# Patient Record
Sex: Female | Born: 1950 | Race: White | Hispanic: No | Smoking: Current every day smoker
Health system: Southern US, Community
[De-identification: ages and names within clinical notes are randomized; demographics above are authoritative.]

## PROBLEM LIST (undated history)

## (undated) DIAGNOSIS — Z860100 Personal history of colon polyps, unspecified: Secondary | ICD-10-CM

## (undated) DIAGNOSIS — F419 Anxiety disorder, unspecified: Secondary | ICD-10-CM

## (undated) DIAGNOSIS — E7800 Pure hypercholesterolemia, unspecified: Secondary | ICD-10-CM

## (undated) DIAGNOSIS — N809 Endometriosis, unspecified: Secondary | ICD-10-CM

## (undated) DIAGNOSIS — K219 Gastro-esophageal reflux disease without esophagitis: Secondary | ICD-10-CM

## (undated) DIAGNOSIS — J189 Pneumonia, unspecified organism: Secondary | ICD-10-CM

## (undated) DIAGNOSIS — K589 Irritable bowel syndrome without diarrhea: Secondary | ICD-10-CM

## (undated) DIAGNOSIS — J45909 Unspecified asthma, uncomplicated: Secondary | ICD-10-CM

## (undated) HISTORY — DX: Anxiety disorder, unspecified: F41.9

## (undated) HISTORY — DX: Irritable bowel syndrome, unspecified: K58.9

## (undated) HISTORY — DX: Unspecified asthma, uncomplicated: J45.909

## (undated) HISTORY — PX: ELBOW SURGERY: SHX618

## (undated) HISTORY — DX: Personal history of colonic polyps: Z86.010

## (undated) HISTORY — PX: HAND SURGERY: SHX662

## (undated) HISTORY — DX: Pure hypercholesterolemia, unspecified: E78.00

## (undated) HISTORY — DX: Endometriosis, unspecified: N80.9

## (undated) HISTORY — DX: Pneumonia, unspecified organism: J18.9

## (undated) HISTORY — PX: LAPAROTOMY: SHX154

## (undated) HISTORY — PX: ABDOMINAL HYSTERECTOMY: SHX81

## (undated) HISTORY — DX: Gastro-esophageal reflux disease without esophagitis: K21.9

## (undated) HISTORY — DX: Personal history of colon polyps, unspecified: Z86.0100

## (undated) HISTORY — PX: STOMACH SURGERY: SHX791

---

## 1998-03-06 ENCOUNTER — Ambulatory Visit (HOSPITAL_BASED_OUTPATIENT_CLINIC_OR_DEPARTMENT_OTHER): Admission: RE | Admit: 1998-03-06 | Discharge: 1998-03-06 | Payer: Self-pay | Admitting: Orthopaedic Surgery

## 2013-11-04 HISTORY — PX: COLONOSCOPY: SHX174

## 2015-10-01 DIAGNOSIS — J449 Chronic obstructive pulmonary disease, unspecified: Secondary | ICD-10-CM

## 2015-10-01 DIAGNOSIS — R911 Solitary pulmonary nodule: Secondary | ICD-10-CM | POA: Insufficient documentation

## 2015-10-01 DIAGNOSIS — J479 Bronchiectasis, uncomplicated: Secondary | ICD-10-CM

## 2015-10-01 DIAGNOSIS — A310 Pulmonary mycobacterial infection: Secondary | ICD-10-CM | POA: Insufficient documentation

## 2015-10-01 HISTORY — DX: Solitary pulmonary nodule: R91.1

## 2015-10-01 HISTORY — DX: Chronic obstructive pulmonary disease, unspecified: J44.9

## 2015-10-01 HISTORY — DX: Pulmonary mycobacterial infection: A31.0

## 2015-10-01 HISTORY — DX: Bronchiectasis, uncomplicated: J47.9

## 2015-11-19 HISTORY — PX: ESOPHAGOGASTRODUODENOSCOPY: SHX1529

## 2016-05-05 DIAGNOSIS — L84 Corns and callosities: Secondary | ICD-10-CM

## 2016-05-05 DIAGNOSIS — M21622 Bunionette of left foot: Secondary | ICD-10-CM

## 2016-05-05 DIAGNOSIS — M89372 Hypertrophy of bone, left ankle and foot: Secondary | ICD-10-CM

## 2016-05-05 HISTORY — DX: Hypertrophy of bone, left ankle and foot: M89.372

## 2016-05-05 HISTORY — DX: Bunionette of left foot: M21.622

## 2016-05-05 HISTORY — DX: Corns and callosities: L84

## 2016-05-31 DIAGNOSIS — L84 Corns and callosities: Secondary | ICD-10-CM | POA: Diagnosis not present

## 2016-05-31 DIAGNOSIS — M21622 Bunionette of left foot: Secondary | ICD-10-CM | POA: Diagnosis not present

## 2016-05-31 DIAGNOSIS — M89372 Hypertrophy of bone, left ankle and foot: Secondary | ICD-10-CM | POA: Diagnosis not present

## 2016-06-14 DIAGNOSIS — R05 Cough: Secondary | ICD-10-CM | POA: Diagnosis not present

## 2016-06-14 DIAGNOSIS — R0602 Shortness of breath: Secondary | ICD-10-CM | POA: Diagnosis not present

## 2016-06-14 DIAGNOSIS — J0100 Acute maxillary sinusitis, unspecified: Secondary | ICD-10-CM | POA: Diagnosis not present

## 2016-08-25 DIAGNOSIS — F331 Major depressive disorder, recurrent, moderate: Secondary | ICD-10-CM | POA: Diagnosis not present

## 2016-08-25 DIAGNOSIS — E782 Mixed hyperlipidemia: Secondary | ICD-10-CM | POA: Diagnosis not present

## 2016-08-25 DIAGNOSIS — J449 Chronic obstructive pulmonary disease, unspecified: Secondary | ICD-10-CM | POA: Diagnosis not present

## 2016-08-25 DIAGNOSIS — H2513 Age-related nuclear cataract, bilateral: Secondary | ICD-10-CM | POA: Diagnosis not present

## 2016-08-25 DIAGNOSIS — K219 Gastro-esophageal reflux disease without esophagitis: Secondary | ICD-10-CM | POA: Diagnosis not present

## 2016-08-25 DIAGNOSIS — M542 Cervicalgia: Secondary | ICD-10-CM | POA: Diagnosis not present

## 2016-10-04 DIAGNOSIS — M89372 Hypertrophy of bone, left ankle and foot: Secondary | ICD-10-CM | POA: Diagnosis not present

## 2016-10-04 DIAGNOSIS — L84 Corns and callosities: Secondary | ICD-10-CM | POA: Diagnosis not present

## 2016-10-04 DIAGNOSIS — M21622 Bunionette of left foot: Secondary | ICD-10-CM | POA: Diagnosis not present

## 2016-10-19 DIAGNOSIS — N393 Stress incontinence (female) (male): Secondary | ICD-10-CM | POA: Diagnosis not present

## 2016-10-19 DIAGNOSIS — N3000 Acute cystitis without hematuria: Secondary | ICD-10-CM | POA: Diagnosis not present

## 2016-11-17 DIAGNOSIS — Z01818 Encounter for other preprocedural examination: Secondary | ICD-10-CM | POA: Diagnosis not present

## 2016-11-17 DIAGNOSIS — N393 Stress incontinence (female) (male): Secondary | ICD-10-CM | POA: Diagnosis not present

## 2016-11-17 DIAGNOSIS — J45909 Unspecified asthma, uncomplicated: Secondary | ICD-10-CM | POA: Diagnosis not present

## 2016-11-17 DIAGNOSIS — Z79899 Other long term (current) drug therapy: Secondary | ICD-10-CM | POA: Diagnosis not present

## 2016-11-17 DIAGNOSIS — N812 Incomplete uterovaginal prolapse: Secondary | ICD-10-CM | POA: Diagnosis not present

## 2016-11-17 DIAGNOSIS — Z87891 Personal history of nicotine dependence: Secondary | ICD-10-CM | POA: Diagnosis not present

## 2016-11-17 DIAGNOSIS — K219 Gastro-esophageal reflux disease without esophagitis: Secondary | ICD-10-CM | POA: Diagnosis not present

## 2016-11-17 HISTORY — PX: CYSTOSCOPY: SUR368

## 2016-11-22 DIAGNOSIS — J018 Other acute sinusitis: Secondary | ICD-10-CM | POA: Diagnosis not present

## 2016-11-24 DIAGNOSIS — N393 Stress incontinence (female) (male): Secondary | ICD-10-CM | POA: Diagnosis not present

## 2016-11-24 DIAGNOSIS — N3020 Other chronic cystitis without hematuria: Secondary | ICD-10-CM | POA: Diagnosis not present

## 2016-12-20 DIAGNOSIS — R32 Unspecified urinary incontinence: Secondary | ICD-10-CM | POA: Diagnosis not present

## 2016-12-20 DIAGNOSIS — N3020 Other chronic cystitis without hematuria: Secondary | ICD-10-CM | POA: Diagnosis not present

## 2016-12-22 DIAGNOSIS — R1013 Epigastric pain: Secondary | ICD-10-CM | POA: Diagnosis not present

## 2016-12-22 DIAGNOSIS — K219 Gastro-esophageal reflux disease without esophagitis: Secondary | ICD-10-CM | POA: Diagnosis not present

## 2017-01-03 DIAGNOSIS — N3020 Other chronic cystitis without hematuria: Secondary | ICD-10-CM | POA: Diagnosis not present

## 2017-01-03 DIAGNOSIS — N393 Stress incontinence (female) (male): Secondary | ICD-10-CM | POA: Diagnosis not present

## 2017-01-03 DIAGNOSIS — N3281 Overactive bladder: Secondary | ICD-10-CM | POA: Diagnosis not present

## 2017-01-03 DIAGNOSIS — R32 Unspecified urinary incontinence: Secondary | ICD-10-CM | POA: Diagnosis not present

## 2017-01-03 DIAGNOSIS — N3090 Cystitis, unspecified without hematuria: Secondary | ICD-10-CM | POA: Diagnosis not present

## 2017-01-13 DIAGNOSIS — I8310 Varicose veins of unspecified lower extremity with inflammation: Secondary | ICD-10-CM | POA: Diagnosis not present

## 2017-01-13 DIAGNOSIS — R233 Spontaneous ecchymoses: Secondary | ICD-10-CM | POA: Diagnosis not present

## 2017-01-27 DIAGNOSIS — N393 Stress incontinence (female) (male): Secondary | ICD-10-CM | POA: Diagnosis not present

## 2017-01-27 DIAGNOSIS — N3020 Other chronic cystitis without hematuria: Secondary | ICD-10-CM | POA: Diagnosis not present

## 2017-01-30 DIAGNOSIS — R0602 Shortness of breath: Secondary | ICD-10-CM | POA: Diagnosis not present

## 2017-01-30 DIAGNOSIS — J189 Pneumonia, unspecified organism: Secondary | ICD-10-CM | POA: Diagnosis not present

## 2017-02-28 DIAGNOSIS — R0602 Shortness of breath: Secondary | ICD-10-CM | POA: Diagnosis not present

## 2017-02-28 DIAGNOSIS — J189 Pneumonia, unspecified organism: Secondary | ICD-10-CM | POA: Diagnosis not present

## 2017-03-09 DIAGNOSIS — J441 Chronic obstructive pulmonary disease with (acute) exacerbation: Secondary | ICD-10-CM | POA: Diagnosis not present

## 2017-03-22 DIAGNOSIS — J4520 Mild intermittent asthma, uncomplicated: Secondary | ICD-10-CM | POA: Diagnosis not present

## 2017-03-22 DIAGNOSIS — J310 Chronic rhinitis: Secondary | ICD-10-CM | POA: Diagnosis not present

## 2017-03-22 DIAGNOSIS — A310 Pulmonary mycobacterial infection: Secondary | ICD-10-CM | POA: Diagnosis not present

## 2017-03-22 DIAGNOSIS — R5383 Other fatigue: Secondary | ICD-10-CM | POA: Diagnosis not present

## 2017-04-04 DIAGNOSIS — A310 Pulmonary mycobacterial infection: Secondary | ICD-10-CM | POA: Diagnosis not present

## 2017-04-04 DIAGNOSIS — J4520 Mild intermittent asthma, uncomplicated: Secondary | ICD-10-CM | POA: Diagnosis not present

## 2017-04-25 DIAGNOSIS — Z1231 Encounter for screening mammogram for malignant neoplasm of breast: Secondary | ICD-10-CM | POA: Diagnosis not present

## 2017-04-27 DIAGNOSIS — E7800 Pure hypercholesterolemia, unspecified: Secondary | ICD-10-CM | POA: Diagnosis not present

## 2017-04-27 DIAGNOSIS — K219 Gastro-esophageal reflux disease without esophagitis: Secondary | ICD-10-CM | POA: Diagnosis not present

## 2017-04-27 DIAGNOSIS — M542 Cervicalgia: Secondary | ICD-10-CM | POA: Diagnosis not present

## 2017-04-27 DIAGNOSIS — R5381 Other malaise: Secondary | ICD-10-CM | POA: Diagnosis not present

## 2017-04-27 DIAGNOSIS — E669 Obesity, unspecified: Secondary | ICD-10-CM | POA: Diagnosis not present

## 2017-04-27 DIAGNOSIS — J328 Other chronic sinusitis: Secondary | ICD-10-CM | POA: Diagnosis not present

## 2017-04-28 DIAGNOSIS — J4520 Mild intermittent asthma, uncomplicated: Secondary | ICD-10-CM | POA: Diagnosis not present

## 2017-04-28 DIAGNOSIS — J310 Chronic rhinitis: Secondary | ICD-10-CM | POA: Diagnosis not present

## 2017-04-28 DIAGNOSIS — R5383 Other fatigue: Secondary | ICD-10-CM | POA: Diagnosis not present

## 2017-04-28 DIAGNOSIS — A310 Pulmonary mycobacterial infection: Secondary | ICD-10-CM | POA: Diagnosis not present

## 2017-05-23 DIAGNOSIS — J310 Chronic rhinitis: Secondary | ICD-10-CM | POA: Diagnosis not present

## 2017-05-23 DIAGNOSIS — J4520 Mild intermittent asthma, uncomplicated: Secondary | ICD-10-CM | POA: Diagnosis not present

## 2017-05-23 DIAGNOSIS — A310 Pulmonary mycobacterial infection: Secondary | ICD-10-CM | POA: Diagnosis not present

## 2017-05-25 DIAGNOSIS — I700 Atherosclerosis of aorta: Secondary | ICD-10-CM | POA: Diagnosis not present

## 2017-05-25 DIAGNOSIS — J984 Other disorders of lung: Secondary | ICD-10-CM | POA: Diagnosis not present

## 2017-05-25 DIAGNOSIS — A310 Pulmonary mycobacterial infection: Secondary | ICD-10-CM | POA: Diagnosis not present

## 2017-05-30 DIAGNOSIS — J310 Chronic rhinitis: Secondary | ICD-10-CM | POA: Diagnosis not present

## 2017-05-30 DIAGNOSIS — A310 Pulmonary mycobacterial infection: Secondary | ICD-10-CM | POA: Diagnosis not present

## 2017-05-30 DIAGNOSIS — J4520 Mild intermittent asthma, uncomplicated: Secondary | ICD-10-CM | POA: Diagnosis not present

## 2017-05-30 DIAGNOSIS — R5383 Other fatigue: Secondary | ICD-10-CM | POA: Diagnosis not present

## 2017-06-02 DIAGNOSIS — R918 Other nonspecific abnormal finding of lung field: Secondary | ICD-10-CM | POA: Diagnosis not present

## 2017-06-02 DIAGNOSIS — R846 Abnormal cytological findings in specimens from respiratory organs and thorax: Secondary | ICD-10-CM | POA: Diagnosis not present

## 2017-06-02 DIAGNOSIS — Z79899 Other long term (current) drug therapy: Secondary | ICD-10-CM | POA: Diagnosis not present

## 2017-06-02 DIAGNOSIS — K219 Gastro-esophageal reflux disease without esophagitis: Secondary | ICD-10-CM | POA: Diagnosis not present

## 2017-06-02 DIAGNOSIS — Z7951 Long term (current) use of inhaled steroids: Secondary | ICD-10-CM | POA: Diagnosis not present

## 2017-06-02 DIAGNOSIS — J45909 Unspecified asthma, uncomplicated: Secondary | ICD-10-CM | POA: Diagnosis not present

## 2017-06-02 DIAGNOSIS — Z87891 Personal history of nicotine dependence: Secondary | ICD-10-CM | POA: Diagnosis not present

## 2017-06-12 DIAGNOSIS — R5383 Other fatigue: Secondary | ICD-10-CM | POA: Diagnosis not present

## 2017-06-12 DIAGNOSIS — J4520 Mild intermittent asthma, uncomplicated: Secondary | ICD-10-CM | POA: Diagnosis not present

## 2017-06-12 DIAGNOSIS — A310 Pulmonary mycobacterial infection: Secondary | ICD-10-CM | POA: Diagnosis not present

## 2017-06-12 DIAGNOSIS — J310 Chronic rhinitis: Secondary | ICD-10-CM | POA: Diagnosis not present

## 2017-06-28 DIAGNOSIS — J4520 Mild intermittent asthma, uncomplicated: Secondary | ICD-10-CM | POA: Diagnosis not present

## 2017-06-28 DIAGNOSIS — J310 Chronic rhinitis: Secondary | ICD-10-CM | POA: Diagnosis not present

## 2017-06-28 DIAGNOSIS — A310 Pulmonary mycobacterial infection: Secondary | ICD-10-CM | POA: Diagnosis not present

## 2017-07-20 DIAGNOSIS — L988 Other specified disorders of the skin and subcutaneous tissue: Secondary | ICD-10-CM | POA: Insufficient documentation

## 2017-07-20 DIAGNOSIS — M21622 Bunionette of left foot: Secondary | ICD-10-CM | POA: Diagnosis not present

## 2017-07-20 DIAGNOSIS — M89372 Hypertrophy of bone, left ankle and foot: Secondary | ICD-10-CM | POA: Diagnosis not present

## 2017-07-20 HISTORY — DX: Other specified disorders of the skin and subcutaneous tissue: L98.8

## 2017-07-24 DIAGNOSIS — E7800 Pure hypercholesterolemia, unspecified: Secondary | ICD-10-CM | POA: Diagnosis not present

## 2017-07-24 DIAGNOSIS — Z6831 Body mass index (BMI) 31.0-31.9, adult: Secondary | ICD-10-CM | POA: Diagnosis not present

## 2017-07-24 DIAGNOSIS — M542 Cervicalgia: Secondary | ICD-10-CM | POA: Diagnosis not present

## 2017-07-24 DIAGNOSIS — E669 Obesity, unspecified: Secondary | ICD-10-CM | POA: Diagnosis not present

## 2017-07-24 DIAGNOSIS — R21 Rash and other nonspecific skin eruption: Secondary | ICD-10-CM | POA: Diagnosis not present

## 2017-07-26 DIAGNOSIS — R5383 Other fatigue: Secondary | ICD-10-CM | POA: Diagnosis not present

## 2017-07-26 DIAGNOSIS — J310 Chronic rhinitis: Secondary | ICD-10-CM | POA: Diagnosis not present

## 2017-07-26 DIAGNOSIS — J4520 Mild intermittent asthma, uncomplicated: Secondary | ICD-10-CM | POA: Diagnosis not present

## 2017-07-26 DIAGNOSIS — A310 Pulmonary mycobacterial infection: Secondary | ICD-10-CM | POA: Diagnosis not present

## 2017-07-27 DIAGNOSIS — R5383 Other fatigue: Secondary | ICD-10-CM | POA: Diagnosis not present

## 2017-07-27 DIAGNOSIS — R05 Cough: Secondary | ICD-10-CM | POA: Diagnosis not present

## 2017-08-09 DIAGNOSIS — R05 Cough: Secondary | ICD-10-CM | POA: Diagnosis not present

## 2017-08-09 DIAGNOSIS — J4520 Mild intermittent asthma, uncomplicated: Secondary | ICD-10-CM | POA: Diagnosis not present

## 2017-08-09 DIAGNOSIS — J310 Chronic rhinitis: Secondary | ICD-10-CM | POA: Diagnosis not present

## 2017-08-09 DIAGNOSIS — A310 Pulmonary mycobacterial infection: Secondary | ICD-10-CM | POA: Diagnosis not present

## 2017-09-19 DIAGNOSIS — J151 Pneumonia due to Pseudomonas: Secondary | ICD-10-CM | POA: Diagnosis not present

## 2017-09-19 DIAGNOSIS — J4520 Mild intermittent asthma, uncomplicated: Secondary | ICD-10-CM | POA: Diagnosis not present

## 2017-09-19 DIAGNOSIS — R5383 Other fatigue: Secondary | ICD-10-CM | POA: Diagnosis not present

## 2017-09-19 DIAGNOSIS — J310 Chronic rhinitis: Secondary | ICD-10-CM | POA: Diagnosis not present

## 2017-09-20 DIAGNOSIS — J151 Pneumonia due to Pseudomonas: Secondary | ICD-10-CM | POA: Diagnosis not present

## 2017-09-20 DIAGNOSIS — J4520 Mild intermittent asthma, uncomplicated: Secondary | ICD-10-CM | POA: Diagnosis not present

## 2017-09-20 DIAGNOSIS — K219 Gastro-esophageal reflux disease without esophagitis: Secondary | ICD-10-CM | POA: Diagnosis not present

## 2017-09-20 DIAGNOSIS — E872 Acidosis: Secondary | ICD-10-CM | POA: Diagnosis not present

## 2017-09-20 DIAGNOSIS — E7800 Pure hypercholesterolemia, unspecified: Secondary | ICD-10-CM | POA: Diagnosis not present

## 2017-09-20 DIAGNOSIS — Z87891 Personal history of nicotine dependence: Secondary | ICD-10-CM | POA: Diagnosis not present

## 2017-09-20 DIAGNOSIS — J9690 Respiratory failure, unspecified, unspecified whether with hypoxia or hypercapnia: Secondary | ICD-10-CM | POA: Diagnosis not present

## 2017-09-20 DIAGNOSIS — R05 Cough: Secondary | ICD-10-CM | POA: Diagnosis not present

## 2017-09-20 DIAGNOSIS — Z452 Encounter for adjustment and management of vascular access device: Secondary | ICD-10-CM | POA: Diagnosis not present

## 2017-09-20 DIAGNOSIS — R0602 Shortness of breath: Secondary | ICD-10-CM | POA: Diagnosis not present

## 2017-09-20 DIAGNOSIS — J9620 Acute and chronic respiratory failure, unspecified whether with hypoxia or hypercapnia: Secondary | ICD-10-CM | POA: Diagnosis not present

## 2017-09-20 DIAGNOSIS — Z792 Long term (current) use of antibiotics: Secondary | ICD-10-CM | POA: Diagnosis not present

## 2017-09-20 DIAGNOSIS — J45901 Unspecified asthma with (acute) exacerbation: Secondary | ICD-10-CM | POA: Diagnosis not present

## 2017-09-23 DIAGNOSIS — J45901 Unspecified asthma with (acute) exacerbation: Secondary | ICD-10-CM | POA: Diagnosis not present

## 2017-09-23 DIAGNOSIS — Z452 Encounter for adjustment and management of vascular access device: Secondary | ICD-10-CM | POA: Diagnosis not present

## 2017-09-23 DIAGNOSIS — B965 Pseudomonas (aeruginosa) (mallei) (pseudomallei) as the cause of diseases classified elsewhere: Secondary | ICD-10-CM | POA: Diagnosis not present

## 2017-09-23 DIAGNOSIS — Z7951 Long term (current) use of inhaled steroids: Secondary | ICD-10-CM | POA: Diagnosis not present

## 2017-09-23 DIAGNOSIS — J9620 Acute and chronic respiratory failure, unspecified whether with hypoxia or hypercapnia: Secondary | ICD-10-CM | POA: Diagnosis not present

## 2017-09-23 DIAGNOSIS — Z7982 Long term (current) use of aspirin: Secondary | ICD-10-CM | POA: Diagnosis not present

## 2017-09-30 DIAGNOSIS — J9620 Acute and chronic respiratory failure, unspecified whether with hypoxia or hypercapnia: Secondary | ICD-10-CM | POA: Diagnosis not present

## 2017-10-02 ENCOUNTER — Encounter: Payer: Self-pay | Admitting: *Deleted

## 2017-10-02 ENCOUNTER — Other Ambulatory Visit: Payer: Self-pay | Admitting: *Deleted

## 2017-10-02 NOTE — Patient Outreach (Signed)
[facility]) Care Management  10/02/2017  Laurie Barr [birth year] [id number]  Referral via [place]; member discharged from [facility] 09/20/2017:  Telephone call to patient who was advised of reason for call & of [facility] care management services.  HIPPA verification received from patient. Patient agreed to answer questions of "TOC" assessment .  Patient voices that she was recently admitted to hospital for Pneumonia that she had been treated for on outpatient basis that was not getting better.   States she is home now and has support from family as needed. States she has home health services in place for support of PICC line & IV antibiotics. States she has learned to administer own antibiotics and last dose will be tomorrow & HH will remove PICC line. States she has follow up appointment -tomorrow with lung specialist and has transportation. States primary care provider wants to see her after specialist appointment. States she will make appointment for next week.  Voices that she is feeling well and has been independent in her care since coming home. States she manages her own medications and is taking medications as ordered by her doctors. States she knows when to notify doctors when she has abnormal symptoms, such as fever, coughing up dark sputum &, trouble breathing.  States she has history of asthma and knows what medication to take if she starts having asthma symptoms.   Voices that she has no health concerns now and does not need [facility] services. States she will call if case management services are needed,  Plan: Case closure. Send MD closure letter.  Sherrin Daisy, RN BSN [place] Management Coordinator [facility] Care Management  [phone]

## 2017-10-03 DIAGNOSIS — J310 Chronic rhinitis: Secondary | ICD-10-CM | POA: Diagnosis not present

## 2017-10-03 DIAGNOSIS — J151 Pneumonia due to Pseudomonas: Secondary | ICD-10-CM | POA: Diagnosis not present

## 2017-10-03 DIAGNOSIS — J4520 Mild intermittent asthma, uncomplicated: Secondary | ICD-10-CM | POA: Diagnosis not present

## 2017-10-03 DIAGNOSIS — R5383 Other fatigue: Secondary | ICD-10-CM | POA: Diagnosis not present

## 2017-10-09 DIAGNOSIS — J189 Pneumonia, unspecified organism: Secondary | ICD-10-CM | POA: Diagnosis not present

## 2017-10-09 DIAGNOSIS — B370 Candidal stomatitis: Secondary | ICD-10-CM | POA: Diagnosis not present

## 2017-10-19 DIAGNOSIS — J45901 Unspecified asthma with (acute) exacerbation: Secondary | ICD-10-CM | POA: Diagnosis not present

## 2017-10-19 DIAGNOSIS — J9620 Acute and chronic respiratory failure, unspecified whether with hypoxia or hypercapnia: Secondary | ICD-10-CM | POA: Diagnosis not present

## 2017-10-20 DIAGNOSIS — J4520 Mild intermittent asthma, uncomplicated: Secondary | ICD-10-CM | POA: Diagnosis not present

## 2017-10-20 DIAGNOSIS — A310 Pulmonary mycobacterial infection: Secondary | ICD-10-CM | POA: Diagnosis not present

## 2017-10-20 DIAGNOSIS — J310 Chronic rhinitis: Secondary | ICD-10-CM | POA: Diagnosis not present

## 2017-10-20 DIAGNOSIS — R5383 Other fatigue: Secondary | ICD-10-CM | POA: Diagnosis not present

## 2017-11-06 ENCOUNTER — Ambulatory Visit: Payer: PPO | Admitting: Podiatry

## 2017-11-08 DIAGNOSIS — H2513 Age-related nuclear cataract, bilateral: Secondary | ICD-10-CM | POA: Diagnosis not present

## 2017-11-08 DIAGNOSIS — D2310 Other benign neoplasm of skin of unspecified eyelid, including canthus: Secondary | ICD-10-CM | POA: Diagnosis not present

## 2017-11-08 DIAGNOSIS — H524 Presbyopia: Secondary | ICD-10-CM | POA: Diagnosis not present

## 2017-11-13 DIAGNOSIS — R300 Dysuria: Secondary | ICD-10-CM | POA: Diagnosis not present

## 2017-11-13 DIAGNOSIS — B370 Candidal stomatitis: Secondary | ICD-10-CM | POA: Diagnosis not present

## 2017-11-13 DIAGNOSIS — E7800 Pure hypercholesterolemia, unspecified: Secondary | ICD-10-CM | POA: Diagnosis not present

## 2017-11-13 DIAGNOSIS — Z79899 Other long term (current) drug therapy: Secondary | ICD-10-CM | POA: Diagnosis not present

## 2017-11-13 DIAGNOSIS — R5381 Other malaise: Secondary | ICD-10-CM | POA: Diagnosis not present

## 2017-11-14 DIAGNOSIS — E7800 Pure hypercholesterolemia, unspecified: Secondary | ICD-10-CM | POA: Diagnosis not present

## 2017-11-14 DIAGNOSIS — Z79899 Other long term (current) drug therapy: Secondary | ICD-10-CM | POA: Diagnosis not present

## 2017-11-15 DIAGNOSIS — L84 Corns and callosities: Secondary | ICD-10-CM | POA: Diagnosis not present

## 2017-11-15 DIAGNOSIS — M89372 Hypertrophy of bone, left ankle and foot: Secondary | ICD-10-CM | POA: Diagnosis not present

## 2017-11-15 DIAGNOSIS — L988 Other specified disorders of the skin and subcutaneous tissue: Secondary | ICD-10-CM | POA: Diagnosis not present

## 2017-11-23 DIAGNOSIS — S50812A Abrasion of left forearm, initial encounter: Secondary | ICD-10-CM | POA: Diagnosis not present

## 2017-11-23 DIAGNOSIS — R21 Rash and other nonspecific skin eruption: Secondary | ICD-10-CM | POA: Diagnosis not present

## 2017-12-25 DIAGNOSIS — M79601 Pain in right arm: Secondary | ICD-10-CM | POA: Diagnosis not present

## 2017-12-25 DIAGNOSIS — J069 Acute upper respiratory infection, unspecified: Secondary | ICD-10-CM | POA: Diagnosis not present

## 2017-12-28 DIAGNOSIS — M5412 Radiculopathy, cervical region: Secondary | ICD-10-CM | POA: Diagnosis not present

## 2017-12-28 DIAGNOSIS — M5090 Cervical disc disorder, unspecified, unspecified cervical region: Secondary | ICD-10-CM | POA: Diagnosis not present

## 2017-12-28 DIAGNOSIS — M47812 Spondylosis without myelopathy or radiculopathy, cervical region: Secondary | ICD-10-CM | POA: Diagnosis not present

## 2018-01-03 DIAGNOSIS — M5090 Cervical disc disorder, unspecified, unspecified cervical region: Secondary | ICD-10-CM | POA: Diagnosis not present

## 2018-01-03 DIAGNOSIS — M5412 Radiculopathy, cervical region: Secondary | ICD-10-CM | POA: Diagnosis not present

## 2018-01-03 DIAGNOSIS — M47812 Spondylosis without myelopathy or radiculopathy, cervical region: Secondary | ICD-10-CM | POA: Diagnosis not present

## 2018-01-05 DIAGNOSIS — M5090 Cervical disc disorder, unspecified, unspecified cervical region: Secondary | ICD-10-CM | POA: Diagnosis not present

## 2018-01-05 DIAGNOSIS — M47812 Spondylosis without myelopathy or radiculopathy, cervical region: Secondary | ICD-10-CM | POA: Diagnosis not present

## 2018-01-05 DIAGNOSIS — M5412 Radiculopathy, cervical region: Secondary | ICD-10-CM | POA: Diagnosis not present

## 2018-01-10 DIAGNOSIS — M5412 Radiculopathy, cervical region: Secondary | ICD-10-CM | POA: Diagnosis not present

## 2018-01-10 DIAGNOSIS — M47812 Spondylosis without myelopathy or radiculopathy, cervical region: Secondary | ICD-10-CM | POA: Diagnosis not present

## 2018-01-10 DIAGNOSIS — M5090 Cervical disc disorder, unspecified, unspecified cervical region: Secondary | ICD-10-CM | POA: Diagnosis not present

## 2018-01-15 DIAGNOSIS — M5412 Radiculopathy, cervical region: Secondary | ICD-10-CM | POA: Diagnosis not present

## 2018-01-15 DIAGNOSIS — M47812 Spondylosis without myelopathy or radiculopathy, cervical region: Secondary | ICD-10-CM | POA: Diagnosis not present

## 2018-01-15 DIAGNOSIS — M5090 Cervical disc disorder, unspecified, unspecified cervical region: Secondary | ICD-10-CM | POA: Diagnosis not present

## 2018-01-18 DIAGNOSIS — M5090 Cervical disc disorder, unspecified, unspecified cervical region: Secondary | ICD-10-CM | POA: Diagnosis not present

## 2018-01-18 DIAGNOSIS — M47812 Spondylosis without myelopathy or radiculopathy, cervical region: Secondary | ICD-10-CM | POA: Diagnosis not present

## 2018-01-18 DIAGNOSIS — M5412 Radiculopathy, cervical region: Secondary | ICD-10-CM | POA: Diagnosis not present

## 2018-01-24 DIAGNOSIS — M5090 Cervical disc disorder, unspecified, unspecified cervical region: Secondary | ICD-10-CM | POA: Diagnosis not present

## 2018-01-25 DIAGNOSIS — Z0000 Encounter for general adult medical examination without abnormal findings: Secondary | ICD-10-CM | POA: Diagnosis not present

## 2018-01-25 DIAGNOSIS — E7800 Pure hypercholesterolemia, unspecified: Secondary | ICD-10-CM | POA: Diagnosis not present

## 2018-01-25 DIAGNOSIS — K219 Gastro-esophageal reflux disease without esophagitis: Secondary | ICD-10-CM | POA: Diagnosis not present

## 2018-01-25 DIAGNOSIS — M542 Cervicalgia: Secondary | ICD-10-CM | POA: Diagnosis not present

## 2018-02-03 DIAGNOSIS — M47812 Spondylosis without myelopathy or radiculopathy, cervical region: Secondary | ICD-10-CM | POA: Diagnosis not present

## 2018-02-03 DIAGNOSIS — M5010 Cervical disc disorder with radiculopathy, unspecified cervical region: Secondary | ICD-10-CM | POA: Diagnosis not present

## 2018-02-06 DIAGNOSIS — M5412 Radiculopathy, cervical region: Secondary | ICD-10-CM | POA: Diagnosis not present

## 2018-02-27 DIAGNOSIS — J0100 Acute maxillary sinusitis, unspecified: Secondary | ICD-10-CM | POA: Diagnosis not present

## 2018-03-06 DIAGNOSIS — R05 Cough: Secondary | ICD-10-CM | POA: Diagnosis not present

## 2018-03-06 DIAGNOSIS — J310 Chronic rhinitis: Secondary | ICD-10-CM | POA: Diagnosis not present

## 2018-03-06 DIAGNOSIS — J209 Acute bronchitis, unspecified: Secondary | ICD-10-CM | POA: Diagnosis not present

## 2018-03-06 DIAGNOSIS — R5383 Other fatigue: Secondary | ICD-10-CM | POA: Diagnosis not present

## 2018-03-06 DIAGNOSIS — J4541 Moderate persistent asthma with (acute) exacerbation: Secondary | ICD-10-CM | POA: Diagnosis not present

## 2018-03-06 DIAGNOSIS — A310 Pulmonary mycobacterial infection: Secondary | ICD-10-CM | POA: Diagnosis not present

## 2018-03-26 DIAGNOSIS — J069 Acute upper respiratory infection, unspecified: Secondary | ICD-10-CM | POA: Diagnosis not present

## 2018-03-26 DIAGNOSIS — R062 Wheezing: Secondary | ICD-10-CM | POA: Diagnosis not present

## 2018-03-26 DIAGNOSIS — R05 Cough: Secondary | ICD-10-CM | POA: Diagnosis not present

## 2018-05-02 DIAGNOSIS — R05 Cough: Secondary | ICD-10-CM | POA: Diagnosis not present

## 2018-05-02 DIAGNOSIS — J310 Chronic rhinitis: Secondary | ICD-10-CM | POA: Diagnosis not present

## 2018-05-02 DIAGNOSIS — J4540 Moderate persistent asthma, uncomplicated: Secondary | ICD-10-CM | POA: Diagnosis not present

## 2018-05-02 DIAGNOSIS — R5383 Other fatigue: Secondary | ICD-10-CM | POA: Diagnosis not present

## 2018-05-15 DIAGNOSIS — Z1231 Encounter for screening mammogram for malignant neoplasm of breast: Secondary | ICD-10-CM | POA: Diagnosis not present

## 2018-06-06 DIAGNOSIS — J4540 Moderate persistent asthma, uncomplicated: Secondary | ICD-10-CM | POA: Diagnosis not present

## 2018-06-06 DIAGNOSIS — J310 Chronic rhinitis: Secondary | ICD-10-CM | POA: Diagnosis not present

## 2018-07-17 DIAGNOSIS — R21 Rash and other nonspecific skin eruption: Secondary | ICD-10-CM | POA: Diagnosis not present

## 2018-07-17 DIAGNOSIS — N3000 Acute cystitis without hematuria: Secondary | ICD-10-CM | POA: Diagnosis not present

## 2018-08-02 DIAGNOSIS — K219 Gastro-esophageal reflux disease without esophagitis: Secondary | ICD-10-CM | POA: Diagnosis not present

## 2018-08-02 DIAGNOSIS — E7800 Pure hypercholesterolemia, unspecified: Secondary | ICD-10-CM | POA: Diagnosis not present

## 2018-08-15 DIAGNOSIS — D485 Neoplasm of uncertain behavior of skin: Secondary | ICD-10-CM | POA: Diagnosis not present

## 2018-08-15 DIAGNOSIS — L309 Dermatitis, unspecified: Secondary | ICD-10-CM | POA: Diagnosis not present

## 2018-08-15 DIAGNOSIS — L300 Nummular dermatitis: Secondary | ICD-10-CM | POA: Diagnosis not present

## 2018-08-23 DIAGNOSIS — R531 Weakness: Secondary | ICD-10-CM | POA: Diagnosis not present

## 2018-08-23 DIAGNOSIS — L309 Dermatitis, unspecified: Secondary | ICD-10-CM | POA: Diagnosis not present

## 2018-08-29 DIAGNOSIS — L300 Nummular dermatitis: Secondary | ICD-10-CM | POA: Diagnosis not present

## 2018-10-09 DIAGNOSIS — J310 Chronic rhinitis: Secondary | ICD-10-CM | POA: Diagnosis not present

## 2018-10-09 DIAGNOSIS — J4540 Moderate persistent asthma, uncomplicated: Secondary | ICD-10-CM | POA: Diagnosis not present

## 2018-10-25 ENCOUNTER — Other Ambulatory Visit: Payer: Self-pay

## 2018-10-31 DIAGNOSIS — Z6832 Body mass index (BMI) 32.0-32.9, adult: Secondary | ICD-10-CM | POA: Diagnosis not present

## 2018-10-31 DIAGNOSIS — B379 Candidiasis, unspecified: Secondary | ICD-10-CM | POA: Diagnosis not present

## 2018-10-31 DIAGNOSIS — N3000 Acute cystitis without hematuria: Secondary | ICD-10-CM | POA: Diagnosis not present

## 2018-11-16 DIAGNOSIS — J4531 Mild persistent asthma with (acute) exacerbation: Secondary | ICD-10-CM | POA: Diagnosis not present

## 2018-11-16 DIAGNOSIS — J310 Chronic rhinitis: Secondary | ICD-10-CM | POA: Diagnosis not present

## 2018-11-29 DIAGNOSIS — M7541 Impingement syndrome of right shoulder: Secondary | ICD-10-CM | POA: Diagnosis not present

## 2018-12-28 DIAGNOSIS — N3020 Other chronic cystitis without hematuria: Secondary | ICD-10-CM | POA: Diagnosis not present

## 2018-12-28 DIAGNOSIS — M7541 Impingement syndrome of right shoulder: Secondary | ICD-10-CM | POA: Diagnosis not present

## 2018-12-28 DIAGNOSIS — M47812 Spondylosis without myelopathy or radiculopathy, cervical region: Secondary | ICD-10-CM | POA: Diagnosis not present

## 2018-12-28 DIAGNOSIS — N3090 Cystitis, unspecified without hematuria: Secondary | ICD-10-CM | POA: Diagnosis not present

## 2018-12-28 DIAGNOSIS — R32 Unspecified urinary incontinence: Secondary | ICD-10-CM | POA: Diagnosis not present

## 2019-01-01 DIAGNOSIS — R062 Wheezing: Secondary | ICD-10-CM | POA: Diagnosis not present

## 2019-01-01 DIAGNOSIS — J4531 Mild persistent asthma with (acute) exacerbation: Secondary | ICD-10-CM | POA: Diagnosis not present

## 2019-01-01 DIAGNOSIS — J310 Chronic rhinitis: Secondary | ICD-10-CM | POA: Diagnosis not present

## 2019-01-04 DIAGNOSIS — M542 Cervicalgia: Secondary | ICD-10-CM | POA: Diagnosis not present

## 2019-01-04 DIAGNOSIS — M5412 Radiculopathy, cervical region: Secondary | ICD-10-CM | POA: Diagnosis not present

## 2019-01-11 DIAGNOSIS — M47812 Spondylosis without myelopathy or radiculopathy, cervical region: Secondary | ICD-10-CM | POA: Diagnosis not present

## 2019-01-11 DIAGNOSIS — M5090 Cervical disc disorder, unspecified, unspecified cervical region: Secondary | ICD-10-CM | POA: Diagnosis not present

## 2019-01-11 DIAGNOSIS — M5412 Radiculopathy, cervical region: Secondary | ICD-10-CM | POA: Diagnosis not present

## 2019-01-15 DIAGNOSIS — H2513 Age-related nuclear cataract, bilateral: Secondary | ICD-10-CM | POA: Diagnosis not present

## 2019-01-28 ENCOUNTER — Encounter: Payer: Self-pay | Admitting: Gastroenterology

## 2019-02-01 DIAGNOSIS — H2511 Age-related nuclear cataract, right eye: Secondary | ICD-10-CM | POA: Diagnosis not present

## 2019-02-01 DIAGNOSIS — H2512 Age-related nuclear cataract, left eye: Secondary | ICD-10-CM | POA: Diagnosis not present

## 2019-02-01 DIAGNOSIS — Z01818 Encounter for other preprocedural examination: Secondary | ICD-10-CM | POA: Diagnosis not present

## 2019-02-11 DIAGNOSIS — R05 Cough: Secondary | ICD-10-CM | POA: Diagnosis not present

## 2019-02-11 DIAGNOSIS — J069 Acute upper respiratory infection, unspecified: Secondary | ICD-10-CM | POA: Diagnosis not present

## 2019-02-13 ENCOUNTER — Encounter: Payer: Self-pay | Admitting: Gastroenterology

## 2019-02-19 DIAGNOSIS — H2512 Age-related nuclear cataract, left eye: Secondary | ICD-10-CM | POA: Diagnosis not present

## 2019-02-19 DIAGNOSIS — Z87891 Personal history of nicotine dependence: Secondary | ICD-10-CM | POA: Diagnosis not present

## 2019-02-19 DIAGNOSIS — K219 Gastro-esophageal reflux disease without esophagitis: Secondary | ICD-10-CM | POA: Diagnosis not present

## 2019-02-19 DIAGNOSIS — E785 Hyperlipidemia, unspecified: Secondary | ICD-10-CM | POA: Diagnosis not present

## 2019-02-19 DIAGNOSIS — Z79899 Other long term (current) drug therapy: Secondary | ICD-10-CM | POA: Diagnosis not present

## 2019-02-19 DIAGNOSIS — F418 Other specified anxiety disorders: Secondary | ICD-10-CM | POA: Diagnosis not present

## 2019-02-19 DIAGNOSIS — H259 Unspecified age-related cataract: Secondary | ICD-10-CM | POA: Diagnosis not present

## 2019-02-19 DIAGNOSIS — J449 Chronic obstructive pulmonary disease, unspecified: Secondary | ICD-10-CM | POA: Diagnosis not present

## 2019-02-19 DIAGNOSIS — Z7982 Long term (current) use of aspirin: Secondary | ICD-10-CM | POA: Diagnosis not present

## 2019-03-14 DIAGNOSIS — K219 Gastro-esophageal reflux disease without esophagitis: Secondary | ICD-10-CM | POA: Diagnosis not present

## 2019-03-14 DIAGNOSIS — E669 Obesity, unspecified: Secondary | ICD-10-CM | POA: Diagnosis not present

## 2019-03-14 DIAGNOSIS — Z0000 Encounter for general adult medical examination without abnormal findings: Secondary | ICD-10-CM | POA: Diagnosis not present

## 2019-03-14 DIAGNOSIS — Z6832 Body mass index (BMI) 32.0-32.9, adult: Secondary | ICD-10-CM | POA: Diagnosis not present

## 2019-03-14 DIAGNOSIS — Z6831 Body mass index (BMI) 31.0-31.9, adult: Secondary | ICD-10-CM | POA: Diagnosis not present

## 2019-03-14 DIAGNOSIS — R300 Dysuria: Secondary | ICD-10-CM | POA: Diagnosis not present

## 2019-03-14 DIAGNOSIS — Z79899 Other long term (current) drug therapy: Secondary | ICD-10-CM | POA: Diagnosis not present

## 2019-03-14 DIAGNOSIS — E7800 Pure hypercholesterolemia, unspecified: Secondary | ICD-10-CM | POA: Diagnosis not present

## 2019-03-26 ENCOUNTER — Encounter: Payer: PPO | Admitting: Gastroenterology

## 2019-04-02 DIAGNOSIS — J310 Chronic rhinitis: Secondary | ICD-10-CM | POA: Diagnosis not present

## 2019-04-02 DIAGNOSIS — R062 Wheezing: Secondary | ICD-10-CM | POA: Diagnosis not present

## 2019-04-02 DIAGNOSIS — J4531 Mild persistent asthma with (acute) exacerbation: Secondary | ICD-10-CM | POA: Diagnosis not present

## 2019-04-12 DIAGNOSIS — S8012XA Contusion of left lower leg, initial encounter: Secondary | ICD-10-CM | POA: Diagnosis not present

## 2019-04-24 DIAGNOSIS — H9209 Otalgia, unspecified ear: Secondary | ICD-10-CM | POA: Diagnosis not present

## 2019-06-03 DIAGNOSIS — M810 Age-related osteoporosis without current pathological fracture: Secondary | ICD-10-CM | POA: Diagnosis not present

## 2019-06-03 DIAGNOSIS — M8589 Other specified disorders of bone density and structure, multiple sites: Secondary | ICD-10-CM | POA: Diagnosis not present

## 2019-06-03 DIAGNOSIS — Z1231 Encounter for screening mammogram for malignant neoplasm of breast: Secondary | ICD-10-CM | POA: Diagnosis not present

## 2019-07-10 DIAGNOSIS — J4531 Mild persistent asthma with (acute) exacerbation: Secondary | ICD-10-CM | POA: Diagnosis not present

## 2019-07-10 DIAGNOSIS — J310 Chronic rhinitis: Secondary | ICD-10-CM | POA: Diagnosis not present

## 2019-07-10 DIAGNOSIS — J069 Acute upper respiratory infection, unspecified: Secondary | ICD-10-CM | POA: Diagnosis not present

## 2019-07-10 DIAGNOSIS — R062 Wheezing: Secondary | ICD-10-CM | POA: Diagnosis not present

## 2019-07-22 DIAGNOSIS — R062 Wheezing: Secondary | ICD-10-CM | POA: Diagnosis not present

## 2019-07-22 DIAGNOSIS — J4531 Mild persistent asthma with (acute) exacerbation: Secondary | ICD-10-CM | POA: Diagnosis not present

## 2019-07-22 DIAGNOSIS — J069 Acute upper respiratory infection, unspecified: Secondary | ICD-10-CM | POA: Diagnosis not present

## 2019-07-22 DIAGNOSIS — J310 Chronic rhinitis: Secondary | ICD-10-CM | POA: Diagnosis not present

## 2019-07-30 DIAGNOSIS — R0602 Shortness of breath: Secondary | ICD-10-CM | POA: Diagnosis not present

## 2019-07-30 DIAGNOSIS — N3000 Acute cystitis without hematuria: Secondary | ICD-10-CM | POA: Diagnosis not present

## 2019-08-05 DIAGNOSIS — R062 Wheezing: Secondary | ICD-10-CM | POA: Diagnosis not present

## 2019-08-05 DIAGNOSIS — J4540 Moderate persistent asthma, uncomplicated: Secondary | ICD-10-CM | POA: Diagnosis not present

## 2019-08-05 DIAGNOSIS — J310 Chronic rhinitis: Secondary | ICD-10-CM | POA: Diagnosis not present

## 2019-08-05 DIAGNOSIS — N3091 Cystitis, unspecified with hematuria: Secondary | ICD-10-CM | POA: Diagnosis not present

## 2019-08-05 DIAGNOSIS — R300 Dysuria: Secondary | ICD-10-CM | POA: Diagnosis not present

## 2019-08-09 ENCOUNTER — Other Ambulatory Visit: Payer: Self-pay

## 2019-08-12 ENCOUNTER — Other Ambulatory Visit: Payer: Self-pay

## 2019-08-12 ENCOUNTER — Encounter: Payer: Self-pay | Admitting: Gastroenterology

## 2019-08-12 ENCOUNTER — Encounter: Payer: Self-pay | Admitting: Cardiology

## 2019-08-12 ENCOUNTER — Ambulatory Visit: Payer: PPO | Admitting: Cardiology

## 2019-08-12 VITALS — BP 130/80 | HR 59 | Ht 66.0 in | Wt 167.0 lb

## 2019-08-12 DIAGNOSIS — E782 Mixed hyperlipidemia: Secondary | ICD-10-CM

## 2019-08-12 DIAGNOSIS — R0600 Dyspnea, unspecified: Secondary | ICD-10-CM | POA: Diagnosis not present

## 2019-08-12 DIAGNOSIS — R079 Chest pain, unspecified: Secondary | ICD-10-CM | POA: Diagnosis not present

## 2019-08-12 DIAGNOSIS — Z5181 Encounter for therapeutic drug level monitoring: Secondary | ICD-10-CM

## 2019-08-12 DIAGNOSIS — R0789 Other chest pain: Secondary | ICD-10-CM

## 2019-08-12 DIAGNOSIS — J470 Bronchiectasis with acute lower respiratory infection: Secondary | ICD-10-CM

## 2019-08-12 DIAGNOSIS — Z79899 Other long term (current) drug therapy: Secondary | ICD-10-CM

## 2019-08-12 DIAGNOSIS — Z87891 Personal history of nicotine dependence: Secondary | ICD-10-CM

## 2019-08-12 DIAGNOSIS — R0609 Other forms of dyspnea: Secondary | ICD-10-CM

## 2019-08-12 HISTORY — DX: Mixed hyperlipidemia: E78.2

## 2019-08-12 HISTORY — DX: Personal history of nicotine dependence: Z87.891

## 2019-08-12 HISTORY — DX: Other chest pain: R07.89

## 2019-08-12 HISTORY — DX: Other forms of dyspnea: R06.09

## 2019-08-12 HISTORY — DX: Dyspnea, unspecified: R06.00

## 2019-08-12 MED ORDER — NITROGLYCERIN 0.4 MG SL SUBL: 0.4000 mg | SUBLINGUAL_TABLET | SUBLINGUAL | 6 refills | Status: DC | PRN

## 2019-08-12 MED ORDER — METOPROLOL TARTRATE 100 MG PO TABS
100.0000 mg | ORAL_TABLET | Freq: Once | ORAL | 0 refills | Status: DC
Start: 2019-08-12 — End: 2019-09-19

## 2019-08-12 NOTE — Patient Instructions (Addendum)
Medication Instructions:  Your physician has recommended you make the following change in your medication:   Take nitroglycerin as needed for chest pain.  *If you need a refill on your cardiac medications before your next appointment, please call your pharmacy*   Lab Work: Your physician recommends that you return for lab work: 1 week prior to your Cardiac Ct.  You can come Monday through Friday 8:30 am to 12:00 pm and 1:15 to 4:30. You do not need to make an appointment as the order has already been placed.   If you have labs (blood work) drawn today and your tests are completely normal, you will receive your results only by: Marland Kitchen MyChart Message (if you have MyChart) OR . A paper copy in the mail If you have any lab test that is abnormal or we need to change your treatment, we will call you to review the results.   Testing/Procedures: Your cardiac CT will be scheduled at:   [facility] [address] [place], [place] [address] [phone]  [facility], please arrive at the [facility] main entrance of [facility] 30 minutes prior to test start time. Proceed to the [facility] Radiology Department (first floor) to check-in and test prep.    Please follow these instructions carefully (unless otherwise directed):   On the Night Before the Test: . Be sure to Drink plenty of water. . Do not consume any caffeinated/decaffeinated beverages or chocolate 12 hours prior to your test. . Do not take any antihistamines 12 hours prior to your test.   On the Day of the Test: . Drink plenty of water. Do not drink any water within one hour of the test. . Do not eat any food 4 hours prior to the test. . You may take your regular medications prior to the test.  . Take metoprolol (Lopressor) two hours prior to test. . FEMALES- please wear underwire-free bra if available       After the Test: . Drink plenty of water. . After receiving IV contrast, you may  experience a mild flushed feeling. This is normal. . On occasion, you may experience a mild rash up to 24 hours after the test. This is not dangerous. If this occurs, you can take Benadryl 25 mg and increase your fluid intake. . If you experience trouble breathing, this can be serious. If it is severe call 911 IMMEDIATELY. If it is mild, please call our office. . If you take any of these medications: Glipizide/Metformin, Avandament, Glucavance, please do not take 48 hours after completing test unless otherwise instructed.   Once we have confirmed authorization from your insurance company, we will call you to set up a date and time for your test.   For non-scheduling related questions, please contact the cardiac imaging nurse navigator should you have any questions/concerns: Marchia Bond, Cardiac Imaging Nurse Navigator Burley Saver, Interim Cardiac Imaging Nurse [place] [facility] Services Direct Office Dial: [phone]   For scheduling needs, including cancellations and rescheduling, please call [phone].      Follow-Up: At [facility], you and your health needs are our priority.  As part of our continuing mission to provide you with exceptional heart care, we have created designated Provider Care Teams.  These Care Teams include your primary Cardiologist (physician) and Advanced Practice Providers (APPs -  Physician Assistants and Nurse Practitioners) who all work together to provide you with the care you need, when you need it.  We recommend signing up for the patient portal called "MyChart".  Sign up information is provided on this After Visit Summary.  MyChart is used to connect with patients for Virtual Visits (Telemedicine).  Patients are able to view lab/test results, encounter notes, upcoming appointments, etc.  Non-urgent messages can be sent to your provider as well.   To learn more about what you can do with MyChart, go to [email or web address].     Your next appointment:   3 month(s)  The format for your next appointment:   In Person  Provider:   Jyl Heinz, MD   Other Instructions NA

## 2019-08-12 NOTE — Progress Notes (Signed)
Cardiology Office Note:    Date:  08/12/2019   ID:  Laurie Barr, DOB 04-Sep-1950, MRN [id number]  PCP:  Greig Right, MD  Cardiologist:  Jenean Lindau, MD   Referring MD: Esperanza Richters, NP    ASSESSMENT:    1. Bronchiectasis with acute lower respiratory infection ([place])   2. Dyspnea on exertion   3. Chest tightness   4. Mixed dyslipidemia   5. Ex-smoker    PLAN:    In order of problems listed above:  1. Primary prevention stressed with the patient.  Importance of compliance with diet medication stressed and she vocalized understanding. 2. Mixed dyslipidemia: Diet emphasized.  Lipids followed by primary care physician. 3. Dyspnea on exertion and chest tightness: This is concerning for anginal equivalent and for this reason I will do a CT FFR.  This will also help me assess pulmonary pathology with her history of pulmonary issues.  I discussed this test with her and explained to her and she vocalized understanding and is agreeable. 4. Cardiac murmur: Echocardiogram will be done to assess murmur heard on auscultation. 5. Sublingual nitroglycerin prescription was sent, its protocol and 911 protocol explained and the patient vocalized understanding questions were answered to the patient's satisfaction 6. Patient will be seen in follow-up appointment in 2 months or earlier if the patient has any concerns    Medication Adjustments/Labs and Tests Ordered: Current medicines are reviewed at length with the patient today.  Concerns regarding medicines are outlined above.  No orders of the defined types were placed in this encounter.  No orders of the defined types were placed in this encounter.    History of Present Illness:    Laurie Barr is a 69 y.o. female who is being seen today for the evaluation of dyspnea on exertion and chest tightness at the request of Wilburn, Nita Sells, NP.  Patient is a pleasant 69 year old female.  She has past medical history of  mixed dyslipidemia and history of smoking.  She mentions to me that in the past 2 months or so she noticed dyspnea on exertion which is a little more than her usual self.  She has also chest tightness.  No radiation to the neck or the arms.  This gets better when she stops ambulating.  No orthopnea or PND.  At the time of my evaluation, the patient is alert awake oriented and in no distress.  Past Medical History:  Diagnosis Date  . 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 Bronchiectasis ([place]) 10/01/2015  . COPD (chronic obstructive pulmonary disease) ([place]) 10/01/2015  . 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 Nodule of right lung 10/01/2015  . Plantar callus 05/05/2016  . Tailor's bunion of left foot 05/05/2016    Past Surgical History:  Procedure Laterality Date  . ABDOMINAL HYSTERECTOMY    . ELBOW SURGERY Left    removed bone due to fall/ Dr. Lorin Mercy  . HAND SURGERY Right    removed cyst  . STOMACH SURGERY     Tumor removed size of grapefruit    Current Medications: Current Meds  Medication Sig  . albuterol (PROVENTIL HFA;VENTOLIN HFA) 108 (90 Base) MCG/ACT inhaler Inhale 2 puffs into the lungs every 6 (six) hours as needed for wheezing or shortness of breath.  Marland Kitchen aspirin 81 MG tablet Take 81 mg by mouth daily.  . baclofen (LIORESAL) 10 MG tablet Take 10 mg by mouth 2 (two) times daily  as needed for muscle spasms.  . fluticasone (FLONASE) 50 MCG/ACT nasal spray Place 2 sprays into both nostrils.  . Fluticasone-Umeclidin-Vilant (TRELEGY ELLIPTA) 100-62.5-25 MCG/INH AEPB Inhale into the lungs.  . montelukast (SINGULAIR) 10 MG tablet Take 10 mg by mouth daily.  Marland Kitchen omeprazole (PRILOSEC) 20 MG capsule Take 20 mg by mouth daily.  . simvastatin (ZOCOR) 20 MG tablet Take 20 mg by mouth daily.     Allergies:   Patient has no known allergies.   Social History   Socioeconomic History  . Marital status: Unknown    Spouse name: Not on  file  . Number of children: Not on file  . Years of education: Not on file  . Highest education level: Not on file  Occupational History  . Not on file  Tobacco Use  . Smoking status: Former Smoker    Quit date: 08/12/2015    Years since quitting: 4.0  . Smokeless tobacco: Never Used  Substance and Sexual Activity  . Alcohol use: Never  . Drug use: Never  . Sexual activity: Not on file  Other Topics Concern  . Not on file  Social History Narrative  . Not on file   Social Determinants of Health   Financial Resource Strain:   . Difficulty of Paying Living Expenses:   Food Insecurity:   . Worried About Charity fundraiser in the Last Year:   . Arboriculturist in the Last Year:   Transportation Needs:   . Film/video editor (Medical):   Marland Kitchen Lack of Transportation (Non-Medical):   Physical Activity:   . Days of Exercise per Week:   . Minutes of Exercise per Session:   Stress:   . Feeling of Stress :   Social Connections:   . Frequency of Communication with Friends and Family:   . Frequency of Social Gatherings with Friends and Family:   . Attends Religious Services:   . Active Member of Clubs or Organizations:   . Attends Archivist Meetings:   Marland Kitchen Marital Status:      Family History: The patient's family history is not on file.  ROS:   Please see the history of present illness.    All other systems reviewed and are negative.  EKGs/Labs/Other Studies Reviewed:    The following studies were reviewed today: EKG reveals sinus rhythm and left bundle branch block.   Recent Labs: No results found for requested labs within last 8760 hours.  Recent Lipid Panel No results found for: CHOL, TRIG, HDL, CHOLHDL, VLDL, LDLCALC, LDLDIRECT  Physical Exam:    VS:  BP 130/80 (BP Location: Left Arm, Patient Position: Sitting, Cuff Size: Normal)   Pulse (!) 59   Ht 5\' 6"  (1.676 m)   Wt 167 lb (75.8 kg)   SpO2 96%   BMI 26.95 kg/m     Wt Readings from Last 3  Encounters:  08/12/19 167 lb (75.8 kg)     GEN: Patient is in no acute distress HEENT: Normal NECK: No JVD; No carotid bruits LYMPHATICS: No lymphadenopathy CARDIAC: S1 S2 regular, 2/6 systolic murmur at the apex. RESPIRATORY:  Clear to auscultation without rales, wheezing or rhonchi  ABDOMEN: Soft, non-tender, non-distended MUSCULOSKELETAL:  No edema; No deformity  SKIN: Warm and dry NEUROLOGIC:  Alert and oriented x 3 PSYCHIATRIC:  Normal affect    Signed, Jenean Lindau, MD  08/12/2019 11:46 AM    [place]

## 2019-08-21 ENCOUNTER — Other Ambulatory Visit: Payer: Self-pay

## 2019-08-21 ENCOUNTER — Ambulatory Visit (INDEPENDENT_AMBULATORY_CARE_PROVIDER_SITE_OTHER): Payer: PPO

## 2019-08-21 DIAGNOSIS — R0600 Dyspnea, unspecified: Secondary | ICD-10-CM

## 2019-08-21 NOTE — Progress Notes (Signed)
2D echocardiogram completed. 

## 2019-08-22 DIAGNOSIS — M722 Plantar fascial fibromatosis: Secondary | ICD-10-CM

## 2019-08-22 DIAGNOSIS — M7661 Achilles tendinitis, right leg: Secondary | ICD-10-CM | POA: Diagnosis not present

## 2019-08-22 DIAGNOSIS — M7662 Achilles tendinitis, left leg: Secondary | ICD-10-CM | POA: Insufficient documentation

## 2019-08-22 DIAGNOSIS — M6701 Short Achilles tendon (acquired), right ankle: Secondary | ICD-10-CM

## 2019-08-22 HISTORY — DX: Short Achilles tendon (acquired), right ankle: M67.01

## 2019-08-22 HISTORY — DX: Plantar fascial fibromatosis: M72.2

## 2019-08-22 HISTORY — DX: Achilles tendinitis, left leg: M76.62

## 2019-09-04 DIAGNOSIS — R062 Wheezing: Secondary | ICD-10-CM | POA: Diagnosis not present

## 2019-09-04 DIAGNOSIS — J4540 Moderate persistent asthma, uncomplicated: Secondary | ICD-10-CM | POA: Diagnosis not present

## 2019-09-04 DIAGNOSIS — J310 Chronic rhinitis: Secondary | ICD-10-CM | POA: Diagnosis not present

## 2019-09-12 DIAGNOSIS — R0600 Dyspnea, unspecified: Secondary | ICD-10-CM | POA: Diagnosis not present

## 2019-09-12 DIAGNOSIS — R0789 Other chest pain: Secondary | ICD-10-CM | POA: Diagnosis not present

## 2019-09-12 LAB — BASIC METABOLIC PANEL
BUN/Creatinine Ratio: 17 (ref 12–28)
BUN: 12 mg/dL (ref 8–27)
CO2: 23 mmol/L (ref 20–29)
Calcium: 8.9 mg/dL (ref 8.7–10.3)
Chloride: 103 mmol/L (ref 96–106)
Creatinine, Ser: 0.71 mg/dL (ref 0.57–1.00)
GFR calc Af Amer: 100 mL/min/{1.73_m2} (ref >59)
GFR calc non Af Amer: 87 mL/min/{1.73_m2} (ref >59)
Glucose: 98 mg/dL (ref 65–99)
Potassium: 4.4 mmol/L (ref 3.5–5.2)
Sodium: 141 mmol/L (ref 134–144)

## 2019-09-16 ENCOUNTER — Telehealth (HOSPITAL_COMMUNITY): Payer: Self-pay | Admitting: *Deleted

## 2019-09-16 NOTE — Telephone Encounter (Signed)

## 2019-09-17 DIAGNOSIS — I2510 Atherosclerotic heart disease of native coronary artery without angina pectoris: Secondary | ICD-10-CM | POA: Diagnosis not present

## 2019-09-18 ENCOUNTER — Ambulatory Visit (HOSPITAL_COMMUNITY)
Admission: RE | Admit: 2019-09-18 | Discharge: 2019-09-18 | Disposition: A | Discharge status: Home / Self Care | Payer: PPO | Source: Ambulatory Visit | Attending: Cardiology | Admitting: Cardiology

## 2019-09-18 ENCOUNTER — Other Ambulatory Visit: Payer: Self-pay

## 2019-09-18 DIAGNOSIS — I2510 Atherosclerotic heart disease of native coronary artery without angina pectoris: Secondary | ICD-10-CM | POA: Insufficient documentation

## 2019-09-18 DIAGNOSIS — R0600 Dyspnea, unspecified: Secondary | ICD-10-CM | POA: Insufficient documentation

## 2019-09-18 DIAGNOSIS — R079 Chest pain, unspecified: Secondary | ICD-10-CM | POA: Insufficient documentation

## 2019-09-18 DIAGNOSIS — R0789 Other chest pain: Secondary | ICD-10-CM | POA: Diagnosis not present

## 2019-09-18 DIAGNOSIS — I700 Atherosclerosis of aorta: Secondary | ICD-10-CM | POA: Insufficient documentation

## 2019-09-18 DIAGNOSIS — R918 Other nonspecific abnormal finding of lung field: Secondary | ICD-10-CM | POA: Insufficient documentation

## 2019-09-18 MED ORDER — NITROGLYCERIN 0.4 MG SL SUBL
SUBLINGUAL_TABLET | SUBLINGUAL | Status: AC
  Filled 2019-09-18: qty 2

## 2019-09-18 MED ORDER — NITROGLYCERIN 0.4 MG SL SUBL
0.8000 mg | SUBLINGUAL_TABLET | SUBLINGUAL | Status: DC | PRN
  Administered 2019-09-18: 0.8 mg via SUBLINGUAL

## 2019-09-18 MED ORDER — IOHEXOL 350 MG/ML SOLN
80.0000 mL | Freq: Once | INTRAVENOUS | Status: AC | PRN
  Administered 2019-09-18: 80 mL via INTRAVENOUS

## 2019-09-18 NOTE — Progress Notes (Signed)
CT scan completed. Tolerated well. D/C home ambulatory with sister. Awake and alert. In no distress

## 2019-09-19 ENCOUNTER — Encounter: Payer: Self-pay | Admitting: Gastroenterology

## 2019-09-19 ENCOUNTER — Telehealth: Payer: Self-pay

## 2019-09-19 ENCOUNTER — Ambulatory Visit: Payer: PPO | Admitting: Gastroenterology

## 2019-09-19 VITALS — BP 102/70 | HR 59 | Temp 97.1°F | Ht 66.0 in | Wt 169.1 lb

## 2019-09-19 DIAGNOSIS — K449 Diaphragmatic hernia without obstruction or gangrene: Secondary | ICD-10-CM

## 2019-09-19 DIAGNOSIS — K635 Polyp of colon: Secondary | ICD-10-CM | POA: Diagnosis not present

## 2019-09-19 DIAGNOSIS — K219 Gastro-esophageal reflux disease without esophagitis: Secondary | ICD-10-CM

## 2019-09-19 DIAGNOSIS — I2510 Atherosclerotic heart disease of native coronary artery without angina pectoris: Secondary | ICD-10-CM | POA: Diagnosis not present

## 2019-09-19 MED ORDER — PANTOPRAZOLE SODIUM 40 MG PO TBEC
40.0000 mg | DELAYED_RELEASE_TABLET | Freq: Every day | ORAL | 11 refills | Status: DC
Start: 2019-09-19 — End: 2022-02-15

## 2019-09-19 NOTE — Patient Instructions (Addendum)
If you are age 69 or older, your body mass index should be between 23-30. Your Body mass index is 27.3 kg/m. If this is out of the aforementioned range listed, please consider follow up with your Primary Care Provider.  If you are age 65 or younger, your body mass index should be between 19-25. Your Body mass index is 27.3 kg/m. If this is out of the aformentioned range listed, please consider follow up with your Primary Care Provider.   We have sent the following medications to your pharmacy for you to pick up at your convenience: Protonix 40 mg daily  EGD/Colon when okayed by Dr. Geraldo Pitter.  Thank you for choosing me and [facility].   Jackquline Denmark, MD

## 2019-09-19 NOTE — Telephone Encounter (Signed)
[facility] Pre-operative Risk Assessment     Request for surgical clearance:     Endoscopy Procedure  What type of surgery is being performed?     Endo/Colon  When is this surgery scheduled?     TBD  What type of clearance is required ?   cardiac  Are there any medications that need to be held prior to surgery and how long?   Practice name and name of physician performing surgery?      [facility]/Dr. Jackquline Denmark  What is your office phone and fax number?      Phone- [phone]  Fax- [phone]  Attn: Peter Congo RMA  Anesthesia type (None, local, MAC, general) ?       MAC

## 2019-09-19 NOTE — Progress Notes (Signed)
Chief Complaint: FU  Referring Provider:  Greig Right, MD      ASSESSMENT AND PLAN;   #1. GERD with HH with epi pain  #2. Colon polyps s/p polypectomy 10/2013  Plan: - Change omeprazole to protonix 40mg  po qd, #30, 11 refills - EGD/colon when OK with Dr Geraldo Pitter (cardiology). - If still with problems, would consider [place] Abdo possibly followed by CT.    HPI:    Laurie Barr is a 69 y.o. female  For follow-up visit.  Known to me from [employer]. With COPD with bronchiectasis, CAD, anxiety/depression,HTN, HLD, history of endometriosis Being evaluated for chest pains and bradycardia by Dr. Geraldo Pitter Underwent CTA heart showing moderate LAD stenosis.  Sent here for possible evaluation for atypical chest pains.  She does give history of epigastric pain which gets worse after eating.  She would occasionally take nonsteroidals.  Having heartburn despite omeprazole.  No odynophagia or dysphagia.  Very much concerned about hiatal hernia and ulcers.  No nausea, vomiting, regurgitation, weight loss.  No jaundice dark urine or pale stools.  She denies having any significant diarrhea or constipation.  She has been taking fiber supplements.  She also has history of tubular adenomas and is due for repeat colonoscopy.  She would like to get EGD/colonoscopy performed at the same time.    CTA IMPRESSION: 1. Coronary calcium score of 71. This was 70th percentile for age and sex matched controls.  2. Normal coronary origin with left dominance.  3. Moderate stenoses in the mid LAD and first diagonal (50-69%).  RECOMMENDATIONS: 1. Moderate stenosis. Consider symptom-guided anti-ischemic pharmacotherapy as well as risk factor modification per guideline directed care. Additional analysis with CT FFR will be submitted.  Previous GI work-up: -CT Abdo/pelvis 03/05/2015: neg -EGD 10/2015: Small transient HH, mild gastritis.  Negative CLOtest.  Negative esophageal biopsies  for EOE. -Colonoscopy 10/2013 ([facility]) colonic polyp s/p polypectomy, mild sigmoid diverticulosis.  Biopsies tubular adenoma.  Repeat in 5 years. Past Medical History:  Diagnosis Date  . 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 Anxiety   . Asthma   . Bronchiectasis ([place]) 10/01/2015  . COPD (chronic obstructive pulmonary disease) ([place]) 10/01/2015  . Endometriosis   . GERD (gastroesophageal reflux disease)   . History of colonic polyps   . Hypercholesterolemia   . 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 IBS (irritable bowel syndrome)   . 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 Nodule of right lung 10/01/2015  . Plantar callus 05/05/2016  . Pneumonia   . Tailor's bunion of left foot 05/05/2016    Past Surgical History:  Procedure Laterality Date  . ABDOMINAL HYSTERECTOMY    . COLONOSCOPY  11/04/2013   Colonic polyp status post polypectomy. Mild sigmoid diverticulosis  . CYSTOSCOPY  11/17/2016  . ELBOW SURGERY Left    removed bone due to fall/ Dr. Lorin Mercy  . ESOPHAGOGASTRODUODENOSCOPY  11/19/2015   Mild gastritis. Transient hiatal hernia. Otherwise, normal EGD  . HAND SURGERY Right    removed cyst  . LAPAROTOMY     multiple due to endometriosis fibrous tumors and adhesions  . STOMACH SURGERY     Tumor removed size of grapefruit    Family History  Problem Relation Age of Onset  . Colon cancer Neg Hx   . Esophageal cancer Neg Hx     Social History   Tobacco Use  . Smoking status: Former Smoker    Quit date: 08/12/2015    Years since quitting: 4.1  .  Smokeless tobacco: Never Used  Vaping Use  . Vaping Use: Never used  Substance Use Topics  . Alcohol use: Not Currently  . Drug use: Never    Current Outpatient Medications  Medication Sig Dispense Refill  . albuterol (PROVENTIL HFA;VENTOLIN HFA) 108 (90 Base) MCG/ACT inhaler Inhale 2 puffs into the lungs every 6 (six) hours as needed for wheezing or shortness of breath.    Marland Kitchen aspirin 81 MG tablet Take 81 mg by  mouth daily as needed.     . baclofen (LIORESAL) 10 MG tablet Take 10 mg by mouth 2 (two) times daily as needed for muscle spasms.    . fluticasone (FLONASE) 50 MCG/ACT nasal spray Place 2 sprays into both nostrils.    . meloxicam (MOBIC) 7.5 MG tablet Take 7.5 mg by mouth daily.    . montelukast (SINGULAIR) 10 MG tablet Take 10 mg by mouth daily.    Marland Kitchen omeprazole (PRILOSEC) 20 MG capsule Take 20 mg by mouth daily.    . simvastatin (ZOCOR) 20 MG tablet Take 20 mg by mouth daily.    . nitroGLYCERIN (NITROSTAT) 0.4 MG SL tablet Place 1 tablet (0.4 mg total) under the tongue every 5 (five) minutes as needed. (Patient not taking: Reported on 09/19/2019) 25 tablet 6   No current facility-administered medications for this visit.    No Known Allergies  Review of Systems:  Neg      Physical Exam:    BP 102/70   Pulse (!) 59   Temp (!) 97.1 F (36.2 C)   Ht 5\' 6"  (1.676 m)   Wt 169 lb 2 oz (76.7 kg)   BMI 27.30 kg/m  Wt Readings from Last 3 Encounters:  09/19/19 169 lb 2 oz (76.7 kg)  08/12/19 167 lb (75.8 kg)   Constitutional:  Well-developed, in no acute distress. Psychiatric: Normal mood and affect. Behavior is normal. HEENT: Pupils normal.  Conjunctivae are normal. No scleral icterus. Neck supple.  Cardiovascular: Normal rate, regular rhythm. No edema Pulmonary/chest: Effort normal.  Bilateral decreased breath sounds. Abdominal: Soft, nondistended.  Mild epigastric tenderness.  Bowel sounds active throughout. There are no masses palpable. No hepatomegaly. Rectal:  defered Neurological: Alert and oriented to person place and time. Skin: Skin is warm and dry. No rashes noted.  Data Reviewed: I have personally reviewed following labs and imaging studies  CBC: No flowsheet data found.  CMP: CMP Latest Ref Rng & Units 09/12/2019  Glucose 65 - 99 mg/dL 98  BUN 8 - 27 mg/dL 12  Creatinine 0.57 - 1.00 mg/dL 0.71  Sodium 134 - 144 mmol/L 141  Potassium 3.5 - 5.2 mmol/L 4.4    Chloride 96 - 106 mmol/L 103  CO2 20 - 29 mmol/L 23  Calcium 8.7 - 10.3 mg/dL 8.9     Radiology Studies: CT CORONARY MORPH W/CTA COR W/SCORE W/CA W/CM &/OR WO/CM  Addendum Date: 09/18/2019   ADDENDUM REPORT: 09/18/2019 18:56 CLINICAL DATA:  Chest pain EXAM: Cardiac/Coronary CTA TECHNIQUE: The patient was scanned on a [employer]. A 100 kV prospective scan was triggered in the descending thoracic aorta at 111 HU's. Axial non-contrast 3 mm slices were carried out through the heart. The data set was analyzed on a dedicated work station and scored using the [place]. Gantry rotation speed was 250 msecs and collimation was .6 mm. No beta blockade and 0.8 mg of sl NTG was given. The 3D data set was reconstructed in 5% intervals of the 35-75 % of the  R-R cycle. Diastolic phases were analyzed on a dedicated work station using MPR, MIP and VRT modes. The patient received 80 cc of contrast. FINDINGS: Image quality: excellent. Noise artifact is: Limited. Coronary Arteries:  Normal coronary origin.  Left dominance. Left main: The left main is a short, large caliber vessel with a normal take off from the left coronary cusp that bifurcates to form a left anterior descending artery and a left circumflex artery. There is minimal calcified plaque (<25%). Left anterior descending artery: The proximal LAD contains mild, mixed density plaque (25-49%). The mid LAD contains moderate non-calcified plaque (50-69%). The distal LAD is patent. The first diagonal contains moderate non-calcified plaque (50-69%). Left circumflex artery: The LCX is dominant and patent with no evidence of plaque or stenosis. OM1 contains minimal non-calcified plaque (<25%). OM2 and OM3 are patent. The LCX terminates as a patent PDA branch. Right coronary artery: The RCA is non-dominant with normal take off from the right coronary cusp. There is no evidence of plaque or stenosis. Right Atrium: Right atrial size is within normal limits.  Right Ventricle: The right ventricular cavity is within normal limits. Left Atrium: Left atrial size is normal in size with no left atrial appendage filling defect. Left Ventricle: The ventricular cavity size is within normal limits. There are no stigmata of prior infarction. There is no abnormal filling defect. Pulmonary arteries: Normal in size without proximal filling defect. Pulmonary veins: Normal pulmonary venous drainage. Pericardium: Normal thickness with no significant effusion or calcium present. Cardiac valves: The aortic valve is trileaflet without significant calcification. The mitral valve is normal structure without significant calcification. Aorta: Normal caliber with no significant disease. Extra-cardiac findings: See attached radiology report for non-cardiac structures. IMPRESSION: 1. Coronary calcium score of 71. This was 70th percentile for age and sex matched controls. 2. Normal coronary origin with left dominance. 3. Moderate stenoses in the mid LAD and first diagonal (50-69%). RECOMMENDATIONS: 1. Moderate stenosis. Consider symptom-guided anti-ischemic pharmacotherapy as well as risk factor modification per guideline directed care. Additional analysis with CT FFR will be submitted. Eleonore Chiquito, MD Electronically Signed   By: Eleonore Chiquito   On: 09/18/2019 18:56   Result Date: 09/18/2019 EXAM: OVER-READ INTERPRETATION  CT CHEST The following report is an over-read performed by radiologist Dr. Vinnie Langton of [facility], [place] on 09/18/2019. This over-read does not include interpretation of cardiac or coronary anatomy or pathology. The coronary calcium score/coronary CTA interpretation by the cardiologist is attached. COMPARISON:  None. FINDINGS: Aortic atherosclerosis. Widespread areas of cylindrical bronchiectasis, many of which have profound thickening of the peribronchovascular interstitium as well as some clustered peribronchovascular micro nodularity. Within the visualized  portions of the thorax there are no suspicious appearing pulmonary nodules or masses, there is no acute consolidative airspace disease, no pleural effusions, no pneumothorax and no lymphadenopathy. Visualized portions of the upper abdomen demonstrates a 1.2 cm intermediate attenuation lesion in segment 4A of the liver, likely to represent a small proteinaceous cyst. There are no aggressive appearing lytic or blastic lesions noted in the visualized portions of the skeleton. IMPRESSION: 1.  Aortic Atherosclerosis (ICD10-I70.0). 2. 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 Electronically Signed: By: Vinnie Langton M.D. On: 09/18/2019 12:33   CT CORONARY FRACTIONAL FLOW RESERVE DATA PREP  Result Date: 09/19/2019 EXAM: CT FFR ANALYSIS CLINICAL DATA:  Chest pain FINDINGS: FFRct analysis was performed on the original cardiac CT angiogram dataset.  Diagrammatic representation of the FFRct analysis is provided in a separate PDF document in PACS. This dictation was created using the PDF document and an interactive 3D model of the results. 3D model is not available in the EMR/PACS. Normal FFR range is >0.80. 1. Left Main: 0.99; no significant stenosis. 2. Mid LAD: 0.82; no significant stenosis. 3. 1st Diagonal: 0.94; no significant stenosis. 4. LCX: 0.96; no significant stenosis. 5. RCA: 0.98; no significant stenosis. IMPRESSION: 1.  CT FFR analysis didn't show any significant stenosis. Eleonore Chiquito, MD Electronically Signed   By: Eleonore Chiquito   On: 09/19/2019 07:26   CT CORONARY FRACTIONAL FLOW RESERVE FLUID ANALYSIS  Result Date: 09/19/2019 EXAM: CT FFR ANALYSIS CLINICAL DATA:  Chest pain FINDINGS: FFRct analysis was performed on the original cardiac CT angiogram dataset. Diagrammatic representation of the FFRct analysis is provided in a separate PDF document in PACS. This dictation was created using the  PDF document and an interactive 3D model of the results. 3D model is not available in the EMR/PACS. Normal FFR range is >0.80. 1. Left Main: 0.99; no significant stenosis. 2. Mid LAD: 0.82; no significant stenosis. 3. 1st Diagonal: 0.94; no significant stenosis. 4. LCX: 0.96; no significant stenosis. 5. RCA: 0.98; no significant stenosis. IMPRESSION: 1.  CT FFR analysis didn't show any significant stenosis. Eleonore Chiquito, MD Electronically Signed   By: Eleonore Chiquito   On: 09/19/2019 07:25   ECHOCARDIOGRAM COMPLETE  Result Date: 08/21/2019    ECHOCARDIOGRAM REPORT   Patient Name:   Carolin Sicks Date of Exam: 08/21/2019 Medical Rec #:  [id number]          Height:       66.0 in Accession #:    [id number]         Weight:       167.0 lb Date of Birth:  06-05-1950          BSA:          1.852 m Patient Age:    13 years           BP:           126/85 mmHg Patient Gender: F                  HR:           54 bpm. Exam Location:  [place] Procedure: 2D Echo Indications:    dyspnea 786.09  History:        Patient has no prior history of Echocardiogram examinations.                 COPD.  Sonographer:    Johny Chess RDCS Referring Phys: [place]  1. Abnormal septal motion. Left ventricular ejection fraction, by estimation, is 55%%. The left ventricle has low normal function. The left ventricle has no regional wall motion abnormalities. Left ventricular diastolic parameters are consistent with Grade I diastolic dysfunction (impaired relaxation).  2. Right ventricular systolic function is normal. The right ventricular size is normal.  3. The mitral valve is normal in structure. No evidence of mitral valve regurgitation. No evidence of mitral stenosis.  4. The aortic valve is tricuspid. Aortic valve regurgitation is not visualized. No aortic stenosis is present.  5. The inferior vena cava is normal in size with greater than 50% respiratory variability, suggesting right atrial pressure of 3  mmHg. FINDINGS  Left Ventricle: Abnormal septal motion. Left ventricular ejection fraction, by estimation, is 55%%. The  left ventricle has low normal function. The left ventricle has no regional wall motion abnormalities. The left ventricular internal cavity size was normal in size. There is no left ventricular hypertrophy. Left ventricular diastolic parameters are consistent with Grade I diastolic dysfunction (impaired relaxation). Indeterminate filling pressures. Right Ventricle: The right ventricular size is normal. No increase in right ventricular wall thickness. Right ventricular systolic function is normal. Left Atrium: Left atrial size was normal in size. Right Atrium: Right atrial size was normal in size. Pericardium: There is no evidence of pericardial effusion. Mitral Valve: The mitral valve is normal in structure. Normal mobility of the mitral valve leaflets. No evidence of mitral valve regurgitation. No evidence of mitral valve stenosis. Tricuspid Valve: The tricuspid valve is normal in structure. Tricuspid valve regurgitation is not demonstrated. No evidence of tricuspid stenosis. Aortic Valve: The aortic valve is tricuspid. Aortic valve regurgitation is not visualized. No aortic stenosis is present. Pulmonic Valve: The pulmonic valve was normal in structure. Pulmonic valve regurgitation is not visualized. No evidence of pulmonic stenosis. Aorta: The aortic root, ascending aorta and aortic arch are all structurally normal, with no evidence of dilitation or obstruction. Venous: The pulmonary veins were not well visualized. The inferior vena cava is normal in size with greater than 50% respiratory variability, suggesting right atrial pressure of 3 mmHg. IAS/Shunts: No atrial level shunt detected by color flow Doppler.  LEFT VENTRICLE PLAX 2D LVIDd:         4.50 cm  Diastology LVIDs:         3.11 cm  LV e' lateral:   8.05 cm/s LV PW:         0.98 cm  LV E/e' lateral: 7.6 LV IVS:        0.95 cm  LV e'  medial:    6.85 cm/s LVOT diam:     2.10 cm  LV E/e' medial:  8.9 LV SV:         72 LV SV Index:   39 LVOT Area:     3.46 cm  RIGHT VENTRICLE            IVC RV S prime:     9.03 cm/s  IVC diam: 1.45 cm TAPSE (M-mode): 1.5 cm LEFT ATRIUM             Index       RIGHT ATRIUM           Index LA diam:        3.40 cm 1.84 cm/m  RA Area:     10.80 cm LA Vol (A2C):   40.6 ml 21.92 ml/m RA Volume:   22.10 ml  11.93 ml/m LA Vol (A4C):   33.8 ml 18.25 ml/m LA Biplane Vol: 37.9 ml 20.46 ml/m  AORTIC VALVE LVOT Vmax:   89.20 cm/s LVOT Vmean:  56.800 cm/s LVOT VTI:    0.208 m  AORTA Ao Root diam: 3.10 cm Ao Asc diam:  3.60 cm MITRAL VALVE MV Area (PHT): 2.62 cm    SHUNTS MV Decel Time: 290 msec    Systemic VTI:  0.21 m MV E velocity: 61.10 cm/s  Systemic Diam: 2.10 cm MV A velocity: 85.40 cm/s MV E/A ratio:  0.72 Shirlee More MD Electronically signed by Shirlee More MD Signature Date/Time: 08/21/2019/12:17:42 PM    Final       Carmell Austria, MD 09/19/2019, 2:46 PM  Cc: Greig Right, MD

## 2019-09-19 NOTE — Telephone Encounter (Signed)
Based on CT coronary angiography and echocardiogram report, patient is not at high risk for coronary events.  Please tell her to walk on a regular basis leading to the endoscopy so she has good effort tolerance and can tolerate the procedure better.  Please let us know if you have any more questions.

## 2019-09-20 ENCOUNTER — Other Ambulatory Visit: Payer: Self-pay | Admitting: Cardiology

## 2019-09-20 MED ORDER — SIMVASTATIN 40 MG PO TABS
40.0000 mg | ORAL_TABLET | Freq: Every day | ORAL | 3 refills | Status: DC
Start: 2019-09-20 — End: 2020-10-19

## 2019-09-20 NOTE — Addendum Note (Signed)
Addended by: Truddie Hidden on: 09/20/2019 02:42 PM   Modules accepted: Orders

## 2019-09-20 NOTE — Telephone Encounter (Signed)
Please go ahead with Endo/colonoscopy as per previous note Cleared by cardiology  RG

## 2019-09-24 DIAGNOSIS — M6701 Short Achilles tendon (acquired), right ankle: Secondary | ICD-10-CM | POA: Diagnosis not present

## 2019-09-24 DIAGNOSIS — M216X2 Other acquired deformities of left foot: Secondary | ICD-10-CM | POA: Diagnosis not present

## 2019-09-24 DIAGNOSIS — M7662 Achilles tendinitis, left leg: Secondary | ICD-10-CM | POA: Diagnosis not present

## 2019-09-24 DIAGNOSIS — M722 Plantar fascial fibromatosis: Secondary | ICD-10-CM | POA: Diagnosis not present

## 2019-09-27 ENCOUNTER — Telehealth: Payer: Self-pay

## 2019-09-27 ENCOUNTER — Other Ambulatory Visit: Payer: Self-pay

## 2019-09-27 DIAGNOSIS — K449 Diaphragmatic hernia without obstruction or gangrene: Secondary | ICD-10-CM

## 2019-09-27 DIAGNOSIS — Z86010 Personal history of colonic polyps: Secondary | ICD-10-CM

## 2019-09-27 NOTE — Telephone Encounter (Signed)
Spoke with patient this morning regarding Endo/colon procedure.  She has been scheduled at [facility] for 11/05/19 at 1:30 pm.  Instructions mailed to patient along with acknowledgment sheet. Patient is going to call back to go over instructions once she receives them, she did not want to be instructed over the phone today.  Patient verbalized understanding.

## 2019-10-21 DIAGNOSIS — N3000 Acute cystitis without hematuria: Secondary | ICD-10-CM | POA: Diagnosis not present

## 2019-10-21 DIAGNOSIS — J069 Acute upper respiratory infection, unspecified: Secondary | ICD-10-CM | POA: Diagnosis not present

## 2019-10-23 ENCOUNTER — Encounter: Payer: Self-pay | Admitting: Gastroenterology

## 2019-11-04 DIAGNOSIS — J310 Chronic rhinitis: Secondary | ICD-10-CM | POA: Diagnosis not present

## 2019-11-04 DIAGNOSIS — J4540 Moderate persistent asthma, uncomplicated: Secondary | ICD-10-CM | POA: Diagnosis not present

## 2019-11-05 ENCOUNTER — Ambulatory Visit (AMBULATORY_SURGERY_CENTER): Payer: PPO | Admitting: Gastroenterology

## 2019-11-05 ENCOUNTER — Other Ambulatory Visit: Payer: Self-pay

## 2019-11-05 ENCOUNTER — Encounter: Payer: Self-pay | Admitting: Gastroenterology

## 2019-11-05 VITALS — BP 106/53 | HR 60 | Temp 97.8°F | Resp 19 | Ht 66.0 in | Wt 169.0 lb

## 2019-11-05 DIAGNOSIS — Z86010 Personal history of colonic polyps: Secondary | ICD-10-CM | POA: Diagnosis not present

## 2019-11-05 DIAGNOSIS — K225 Diverticulum of esophagus, acquired: Secondary | ICD-10-CM | POA: Diagnosis not present

## 2019-11-05 DIAGNOSIS — K2950 Unspecified chronic gastritis without bleeding: Secondary | ICD-10-CM | POA: Diagnosis not present

## 2019-11-05 DIAGNOSIS — K449 Diaphragmatic hernia without obstruction or gangrene: Secondary | ICD-10-CM

## 2019-11-05 DIAGNOSIS — K2970 Gastritis, unspecified, without bleeding: Secondary | ICD-10-CM | POA: Diagnosis not present

## 2019-11-05 DIAGNOSIS — D125 Benign neoplasm of sigmoid colon: Secondary | ICD-10-CM

## 2019-11-05 DIAGNOSIS — K635 Polyp of colon: Secondary | ICD-10-CM

## 2019-11-05 DIAGNOSIS — K219 Gastro-esophageal reflux disease without esophagitis: Secondary | ICD-10-CM

## 2019-11-05 MED ORDER — SODIUM CHLORIDE 0.9 % IV SOLN: 500.0000 mL | Freq: Once | INTRAVENOUS | Status: DC

## 2019-11-05 NOTE — Progress Notes (Signed)
PT taken to PACU. Monitors in place. VSS. Report given to RN. 

## 2019-11-05 NOTE — Op Note (Signed)
[place] Patient Name: Laurie Barr Procedure Date: 11/05/2019 1:38 PM MRN: [id number] Endoscopist: Jackquline Denmark , MD Age: 69 Referring MD:  Date of Birth: [birth year] Gender: Female Account #: [id number] Procedure:                Upper GI endoscopy Indications:              Epigastric abdominal pain/noncardiac chest pains. Medicines:                Monitored Anesthesia Care Procedure:                Pre-Anesthesia Assessment:                           - Prior to the procedure, a History and Physical                            was performed, and patient medications and                            allergies were reviewed. The patient's tolerance of                            previous anesthesia was also reviewed. The risks                            and benefits of the procedure and the sedation                            options and risks were discussed with the patient.                            All questions were answered, and informed consent                            was obtained. Prior Anticoagulants: The patient has                            taken no previous anticoagulant or antiplatelet                            agents. ASA Grade Assessment: III - A patient with                            severe systemic disease. After reviewing the risks                            and benefits, the patient was deemed in                            satisfactory condition to undergo the procedure.                           After obtaining informed consent, the endoscope was  passed under direct vision. Throughout the                            procedure, the patient's blood pressure, pulse, and                            oxygen saturations were monitored continuously. The                            Endoscope was introduced through the mouth, and                            advanced to the second part of duodenum. The upper                            GI  endoscopy was accomplished without difficulty.                            The patient tolerated the procedure well. Scope In: Scope Out: Findings:                 The examined esophagus was slightly tortuous but                            normal with well-defined Z-line at 38 cm. Examined                            by NBI. The esophagus was not biopsied since                            previous biopsies were negative for EoE                           A small hiatal hernia was present. The retroflexed                            examination of the cardia revealed GE junction flap                            to be [employer] III.                           Localized minimal inflammation characterized by                            erythema was found in the gastric antrum and in the                            prepyloric region of the stomach. Biopsies were                            taken with a cold forceps for histology.  A 10 mm non-bleeding diverticulum was found in the                            second portion of the duodenum. The remaining                            duodenum was normal. Complications:            No immediate complications. Estimated Blood Loss:     Estimated blood loss: none. Impression:               - Small hiatal hernia.                           - Mild gastritis. Biopsied.                           - Incidental non-bleeding duodenal diverticulum. Recommendation:           - Patient has a contact number available for                            emergencies. The signs and symptoms of potential                            delayed complications were discussed with the                            patient. Return to normal activities tomorrow.                            Written discharge instructions were provided to the                            patient.                           - Resume previous diet.                           - Continue present  medications.                           - Await pathology results.                           - The findings and recommendations were discussed                            with the patient's family. Jackquline Denmark, MD 11/05/2019 2:08:38 PM This report has been signed electronically.

## 2019-11-05 NOTE — Progress Notes (Signed)
Vitals-CW  Pt's states no medical or surgical changes since previsit or office visit. 

## 2019-11-05 NOTE — Op Note (Signed)
[facility] Patient Name: Laurie Barr Procedure Date: 11/05/2019 1:37 PM MRN: [id number] Endoscopist: Jackquline Denmark , MD Age: 69 Referring MD:  Date of Birth: [birth year] Gender: Female Account #: [id number] Procedure:                Colonoscopy Indications:              High risk colon cancer surveillance: Personal                            history of colonic polyps Medicines:                Monitored Anesthesia Care Procedure:                Pre-Anesthesia Assessment:                           - Prior to the procedure, a History and Physical                            was performed, and patient medications and                            allergies were reviewed. The patient's tolerance of                            previous anesthesia was also reviewed. The risks                            and benefits of the procedure and the sedation                            options and risks were discussed with the patient.                            All questions were answered, and informed consent                            was obtained. Prior Anticoagulants: The patient has                            taken no previous anticoagulant or antiplatelet                            agents. ASA Grade Assessment: III - A patient with                            severe systemic disease. After reviewing the risks                            and benefits, the patient was deemed in                            satisfactory condition to undergo the procedure.  After obtaining informed consent, the colonoscope                            was passed under direct vision. Throughout the                            procedure, the patient's blood pressure, pulse, and                            oxygen saturations were monitored continuously. The                            Colonoscope was introduced through the anus and                            advanced to the 2 cm into the ileum.  The                            colonoscopy was performed without difficulty. The                            patient tolerated the procedure well. The quality                            of the bowel preparation was good. The terminal                            ileum, ileocecal valve, appendiceal orifice, and                            rectum were photographed. Scope In: 1:50:03 PM Scope Out: 2:01:20 PM Scope Withdrawal Time: 0 hours 8 minutes 33 seconds  Total Procedure Duration: 0 hours 11 minutes 17 seconds  Findings:                 Two sessile polyps were found in the distal sigmoid                            colon. The polyps were 2 to 6 mm in size. These                            polyps were removed with a cold snare. Resection                            and retrieval were complete.                           A few small-mouthed diverticula were found in the                            sigmoid colon and rare diverticula in transverse                            colon.  Non-bleeding internal hemorrhoids were found during                            retroflexion. The hemorrhoids were small.                           The terminal ileum appeared normal.                           The exam was otherwise without abnormality on                            direct and retroflexion views. Complications:            No immediate complications. Estimated Blood Loss:     Estimated blood loss: none. Impression:               - Two 2 to 6 mm polyps in the distal sigmoid colon,                            removed with a cold snare. Resected and retrieved.                           - Mild predominantly sigmoid diverticulosis.                           - Otherwise normal colonoscopy to TI. Recommendation:           - Patient has a contact number available for                            emergencies. The signs and symptoms of potential                            delayed complications  were discussed with the                            patient. Return to normal activities tomorrow.                            Written discharge instructions were provided to the                            patient.                           - High fiber diet.                           - Continue present medications.                           - Await pathology results.                           - Repeat colonoscopy for surveillance based on  pathology results.                           - The findings and recommendations were discussed                            with the patient's Sister Tora Perches, MD 11/05/2019 2:15:03 PM This report has been signed electronically.

## 2019-11-05 NOTE — Patient Instructions (Signed)
YOU HAD AN ENDOSCOPIC PROCEDURE TODAY AT THE [facility]:   Refer to the procedure report that was given to you for any specific questions about what was found during the examination.  If the procedure report does not answer your questions, please call your gastroenterologist to clarify.  If you requested that your care partner not be given the details of your procedure findings, then the procedure report has been included in a sealed envelope for you to review at your convenience later.  YOU SHOULD EXPECT: Some feelings of bloating in the abdomen. Passage of more gas than usual.  Walking can help get rid of the air that was put into your GI tract during the procedure and reduce the bloating. If you had a lower endoscopy (such as a colonoscopy or flexible sigmoidoscopy) you may notice spotting of blood in your stool or on the toilet paper. If you underwent a bowel prep for your procedure, you may not have a normal bowel movement for a few days.  Please Note:  You might notice some irritation and congestion in your nose or some drainage.  This is from the oxygen used during your procedure.  There is no need for concern and it should clear up in a day or so.  SYMPTOMS TO REPORT IMMEDIATELY:   Following lower endoscopy (colonoscopy or flexible sigmoidoscopy):  Excessive amounts of blood in the stool  Significant tenderness or worsening of abdominal pains  Swelling of the abdomen that is new, acute  Fever of 100F or higher   Following upper endoscopy (EGD)  Vomiting of blood or coffee ground material  New chest pain or pain under the shoulder blades  Painful or persistently difficult swallowing  New shortness of breath  Fever of 100F or higher  Black, tarry-looking stools  For urgent or emergent issues, a gastroenterologist can be reached at any hour by calling [phone]. Do not use MyChart messaging for urgent concerns.    DIET:  We do recommend a small meal at first, but  then you may proceed to your regular diet.  Drink plenty of fluids but you should avoid alcoholic beverages for 24 hours.  ACTIVITY:  You should plan to take it easy for the rest of today and you should NOT DRIVE or use heavy machinery until tomorrow (because of the sedation medicines used during the test).    FOLLOW UP: Our staff will call the number listed on your records 48-72 hours following your procedure to check on you and address any questions or concerns that you may have regarding the information given to you following your procedure. If we do not reach you, we will leave a message.  We will attempt to reach you two times.  During this call, we will ask if you have developed any symptoms of COVID 19. If you develop any symptoms (ie: fever, flu-like symptoms, shortness of breath, cough etc.) before then, please call [phone].  If you test positive for Covid 19 in the 2 weeks post procedure, please call and report this information to us.    If any biopsies were taken you will be contacted by phone or by letter within the next 1-3 weeks.  Please call us at [phone] if you have not heard about the biopsies in 3 weeks.    SIGNATURES/CONFIDENTIALITY: You and/or your care partner have signed paperwork which will be entered into your electronic medical record.  These signatures attest to the fact that that the information above on   your After Visit Summary has been reviewed and is understood.  Full responsibility of the confidentiality of this discharge information lies with you and/or your care-partner. 

## 2019-11-05 NOTE — Progress Notes (Signed)
Called to room to assist during endoscopic procedure.  Patient ID and intended procedure confirmed with present staff. Received instructions for my participation in the procedure from the performing physician.  

## 2019-11-07 ENCOUNTER — Telehealth: Payer: Self-pay | Admitting: *Deleted

## 2019-11-07 NOTE — Telephone Encounter (Signed)
  Follow up Call-  Call back number 11/05/2019  Post procedure Call Back phone  # [phone]  Permission to leave phone message Yes  Some recent data might be hidden     Patient questions:  Do you have a fever, pain , or abdominal swelling? No. Pain Score  0 *  Have you tolerated food without any problems? yes  Have you been able to return to your normal activities? Yes.    Do you have any questions about your discharge instructions: Diet   No. Medications  No. Follow up visit  No.  Do you have questions or concerns about your Care? No.  Actions: * If pain score is 4 or above: No action needed, pain <4.   1. Have you developed a fever since your procedure? no  2.   Have you had an respiratory symptoms (SOB or cough) since your procedure? no  3.   Have you tested positive for COVID 19 since your procedure no  4.   Have you had any family members/close contacts diagnosed with the COVID 19 since your procedure?  no   If yes to any of these questions please route to Joylene John, RN and Joella Prince, RN

## 2019-11-12 ENCOUNTER — Encounter: Payer: Self-pay | Admitting: Gastroenterology

## 2019-11-12 DIAGNOSIS — M6701 Short Achilles tendon (acquired), right ankle: Secondary | ICD-10-CM | POA: Diagnosis not present

## 2019-11-12 DIAGNOSIS — M722 Plantar fascial fibromatosis: Secondary | ICD-10-CM | POA: Diagnosis not present

## 2019-11-12 DIAGNOSIS — L988 Other specified disorders of the skin and subcutaneous tissue: Secondary | ICD-10-CM | POA: Diagnosis not present

## 2019-11-15 DIAGNOSIS — E669 Obesity, unspecified: Secondary | ICD-10-CM | POA: Diagnosis not present

## 2019-11-15 DIAGNOSIS — I700 Atherosclerosis of aorta: Secondary | ICD-10-CM | POA: Diagnosis not present

## 2019-11-15 DIAGNOSIS — I2584 Coronary atherosclerosis due to calcified coronary lesion: Secondary | ICD-10-CM | POA: Diagnosis not present

## 2019-11-15 DIAGNOSIS — Z0000 Encounter for general adult medical examination without abnormal findings: Secondary | ICD-10-CM | POA: Diagnosis not present

## 2019-11-15 DIAGNOSIS — N3000 Acute cystitis without hematuria: Secondary | ICD-10-CM | POA: Diagnosis not present

## 2019-11-15 DIAGNOSIS — M810 Age-related osteoporosis without current pathological fracture: Secondary | ICD-10-CM | POA: Diagnosis not present

## 2019-11-15 DIAGNOSIS — Z6831 Body mass index (BMI) 31.0-31.9, adult: Secondary | ICD-10-CM | POA: Diagnosis not present

## 2019-11-15 DIAGNOSIS — Z79899 Other long term (current) drug therapy: Secondary | ICD-10-CM | POA: Diagnosis not present

## 2019-11-26 ENCOUNTER — Ambulatory Visit: Payer: PPO | Admitting: Cardiology

## 2019-12-18 ENCOUNTER — Other Ambulatory Visit: Payer: Self-pay

## 2019-12-18 DIAGNOSIS — N809 Endometriosis, unspecified: Secondary | ICD-10-CM | POA: Insufficient documentation

## 2019-12-18 DIAGNOSIS — Z86010 Personal history of colonic polyps: Secondary | ICD-10-CM | POA: Insufficient documentation

## 2019-12-18 DIAGNOSIS — F419 Anxiety disorder, unspecified: Secondary | ICD-10-CM | POA: Insufficient documentation

## 2019-12-18 DIAGNOSIS — E7800 Pure hypercholesterolemia, unspecified: Secondary | ICD-10-CM | POA: Insufficient documentation

## 2019-12-18 DIAGNOSIS — K589 Irritable bowel syndrome without diarrhea: Secondary | ICD-10-CM | POA: Insufficient documentation

## 2019-12-18 DIAGNOSIS — K219 Gastro-esophageal reflux disease without esophagitis: Secondary | ICD-10-CM | POA: Insufficient documentation

## 2019-12-18 DIAGNOSIS — J189 Pneumonia, unspecified organism: Secondary | ICD-10-CM | POA: Insufficient documentation

## 2019-12-18 DIAGNOSIS — J45909 Unspecified asthma, uncomplicated: Secondary | ICD-10-CM | POA: Insufficient documentation

## 2019-12-19 ENCOUNTER — Other Ambulatory Visit: Payer: Self-pay

## 2019-12-19 ENCOUNTER — Ambulatory Visit: Payer: PPO | Admitting: Cardiology

## 2019-12-19 ENCOUNTER — Encounter: Payer: Self-pay | Admitting: Cardiology

## 2019-12-19 VITALS — BP 154/82 | HR 66 | Ht 66.0 in | Wt 166.4 lb

## 2019-12-19 DIAGNOSIS — I2510 Atherosclerotic heart disease of native coronary artery without angina pectoris: Secondary | ICD-10-CM

## 2019-12-19 DIAGNOSIS — F17210 Nicotine dependence, cigarettes, uncomplicated: Secondary | ICD-10-CM

## 2019-12-19 DIAGNOSIS — E7800 Pure hypercholesterolemia, unspecified: Secondary | ICD-10-CM

## 2019-12-19 DIAGNOSIS — Z87891 Personal history of nicotine dependence: Secondary | ICD-10-CM

## 2019-12-19 DIAGNOSIS — E782 Mixed hyperlipidemia: Secondary | ICD-10-CM | POA: Diagnosis not present

## 2019-12-19 HISTORY — DX: Atherosclerotic heart disease of native coronary artery without angina pectoris: I25.10

## 2019-12-19 LAB — CBC WITH DIFFERENTIAL/PLATELET
Basophils Absolute: 0.1 10*3/uL (ref 0.0–0.2)
Basos: 1 %
EOS (ABSOLUTE): 0.2 10*3/uL (ref 0.0–0.4)
Eos: 3 %
Hematocrit: 41.5 % (ref 34.0–46.6)
Hemoglobin: 13.6 g/dL (ref 11.1–15.9)
Immature Grans (Abs): 0 10*3/uL (ref 0.0–0.1)
Immature Granulocytes: 0 %
Lymphocytes Absolute: 1.1 10*3/uL (ref 0.7–3.1)
Lymphs: 18 %
MCH: 30.1 pg (ref 26.6–33.0)
MCHC: 32.8 g/dL (ref 31.5–35.7)
MCV: 92 fL (ref 79–97)
Monocytes Absolute: 0.5 10*3/uL (ref 0.1–0.9)
Monocytes: 8 %
Neutrophils Absolute: 4.5 10*3/uL (ref 1.4–7.0)
Neutrophils: 70 %
Platelets: 211 10*3/uL (ref 150–450)
RBC: 4.52 x10E6/uL (ref 3.77–5.28)
RDW: 13.1 % (ref 11.7–15.4)
WBC: 6.4 10*3/uL (ref 3.4–10.8)

## 2019-12-19 LAB — LIPID PANEL
Chol/HDL Ratio: 2.8 ratio (ref 0.0–4.4)
Cholesterol, Total: 180 mg/dL (ref 100–199)
HDL: 65 mg/dL (ref >39)
LDL Chol Calc (NIH): 94 mg/dL (ref 0–99)
Triglycerides: 122 mg/dL (ref 0–149)
VLDL Cholesterol Cal: 21 mg/dL (ref 5–40)

## 2019-12-19 LAB — HEPATIC FUNCTION PANEL
ALT: 21 IU/L (ref 0–32)
AST: 25 IU/L (ref 0–40)
Albumin: 4.3 g/dL (ref 3.8–4.8)
Alkaline Phosphatase: 76 IU/L (ref 44–121)
Bilirubin Total: 0.7 mg/dL (ref 0.0–1.2)
Bilirubin, Direct: 0.17 mg/dL (ref 0.00–0.40)
Total Protein: 6.2 g/dL (ref 6.0–8.5)

## 2019-12-19 LAB — BASIC METABOLIC PANEL
BUN/Creatinine Ratio: 12 (ref 12–28)
BUN: 9 mg/dL (ref 8–27)
CO2: 26 mmol/L (ref 20–29)
Calcium: 9.1 mg/dL (ref 8.7–10.3)
Chloride: 104 mmol/L (ref 96–106)
Creatinine, Ser: 0.76 mg/dL (ref 0.57–1.00)
GFR calc Af Amer: 93 mL/min/{1.73_m2} (ref >59)
GFR calc non Af Amer: 80 mL/min/{1.73_m2} (ref >59)
Glucose: 103 mg/dL — ABNORMAL HIGH (ref 65–99)
Potassium: 4.4 mmol/L (ref 3.5–5.2)
Sodium: 142 mmol/L (ref 134–144)

## 2019-12-19 LAB — TSH: TSH: 2.13 u[IU]/mL (ref 0.450–4.500)

## 2019-12-19 NOTE — Progress Notes (Signed)
Cardiology Office Note:    Date:  12/19/2019   ID:  DERIYAH KUNATH, DOB Nov 23, 1950, MRN [id number]  PCP:  Greig Right, MD  Cardiologist:  Jenean Lindau, MD   Referring MD: Greig Right, MD    ASSESSMENT:    1. Hypercholesterolemia   2. Coronary artery disease involving native coronary artery of native heart without angina pectoris   3. Mixed dyslipidemia   4. Ex-smoker    PLAN:    In order of problems listed above:  1. Coronary artery disease: Secondary prevention stressed to the patient.  Importance of compliance with diet medication stressed and she vocalized understanding.  She does have nitroglycerin and knows how to use it.  She takes aspirin on a regular basis.  Importance of regular exercise stressed I told her to walk at least half an hour a day 5 days a week and she promises to do so. 2. Essential hypertension: Blood pressure is stable.  She tells me that he is a little stressed out today because her car would not start up.  Lifestyle modification were advised and she will monitor her blood pressures at home.  Home readings readings to me appear unremarkable. 3. Mixed dyslipidemia: Diet was emphasized and she will have blood work today as she is fasting. 4. Cigarette smoker: I spent 5 minutes with the patient discussing solely about smoking. Smoking cessation was counseled. I suggested to the patient also different medications and pharmacological interventions. Patient is keen to try stopping on its own at this time. He will get back to me if he needs any further assistance in this matter. 5. Patient will be seen in follow-up appointment in 4 months or earlier if the patient has any concerns    Medication Adjustments/Labs and Tests Ordered: Current medicines are reviewed at length with the patient today.  Concerns regarding medicines are outlined above.  No orders of the defined types were placed in this encounter.  No orders of the defined types were placed in  this encounter.    No chief complaint on file.    History of Present Illness:    Michaelene A Sporrer is a 69 y.o. female.  Patient has past medical history of coronary artery disease and the details are mentioned below.  She has history of essential hypertension dyslipidemia and unfortunately continues to smoke.  She denies any chest pain orthopnea or PND.  She leads a sedentary lifestyle.  At the time of my evaluation, the patient is alert awake oriented and in no distress.  Past Medical History:  Diagnosis Date  . Achilles tendinitis of left lower extremity 08/22/2019  . 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 Anxiety   . Asthma   . Bronchiectasis ([place]) 10/01/2015  . Chest tightness 08/12/2019  . COPD (chronic obstructive pulmonary disease) ([place]) 10/01/2015  . Dyspnea on exertion 08/12/2019  . Endometriosis   . Ex-smoker 08/12/2019  . GERD (gastroesophageal reflux disease)   . History of colonic polyps   . Hypercholesterolemia   . 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 IBS (irritable bowel syndrome)   . Mixed dyslipidemia 08/12/2019  . 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 Nodule of right lung 10/01/2015  . Plantar callus 05/05/2016  . Plantar fasciitis 08/22/2019  . Pneumonia   . Tailor's bunion of left foot 05/05/2016  . Tightness of heel cord, right 08/22/2019    Past Surgical History:  Procedure Laterality Date  . ABDOMINAL HYSTERECTOMY    . COLONOSCOPY  11/04/2013   Colonic polyp status post polypectomy. Mild sigmoid diverticulosis  . CYSTOSCOPY  11/17/2016  . ELBOW SURGERY Left    removed bone due to fall/ Dr. Lorin Mercy  . ESOPHAGOGASTRODUODENOSCOPY  11/19/2015   Mild gastritis. Transient hiatal hernia. Otherwise, normal EGD  . HAND SURGERY Right    removed cyst  . LAPAROTOMY     multiple due to endometriosis fibrous tumors and adhesions  . STOMACH SURGERY     Tumor removed size of grapefruit    Current Medications: Current Meds  Medication Sig  .  albuterol (PROVENTIL HFA;VENTOLIN HFA) 108 (90 Base) MCG/ACT inhaler Inhale 2 puffs into the lungs every 6 (six) hours as needed for wheezing or shortness of breath.  Marland Kitchen aspirin 81 MG tablet Take 81 mg by mouth daily as needed.   . baclofen (LIORESAL) 10 MG tablet Take 10 mg by mouth 2 (two) times daily as needed for muscle spasms.  . fluticasone (FLONASE) 50 MCG/ACT nasal spray Place 2 sprays into both nostrils.  . meloxicam (MOBIC) 7.5 MG tablet Take 7.5 mg by mouth daily.  Marland Kitchen omeprazole (PRILOSEC) 20 MG capsule Take 20 mg by mouth daily.  . pantoprazole (PROTONIX) 40 MG tablet Take 1 tablet (40 mg total) by mouth daily.  . simvastatin (ZOCOR) 40 MG tablet Take 1 tablet (40 mg total) by mouth at bedtime.     Allergies:   Patient has no known allergies.   Social History   Socioeconomic History  . Marital status: Unknown    Spouse name: Not on file  . Number of children: Not on file  . Years of education: Not on file  . Highest education level: Not on file  Occupational History  . Not on file  Tobacco Use  . Smoking status: Former Smoker    Quit date: 08/12/2015    Years since quitting: 4.3  . Smokeless tobacco: Never Used  Vaping Use  . Vaping Use: Never used  Substance and Sexual Activity  . Alcohol use: Not Currently  . Drug use: Never  . Sexual activity: Not on file  Other Topics Concern  . Not on file  Social History Narrative  . Not on file   Social Determinants of Health   Financial Resource Strain:   . Difficulty of Paying Living Expenses: Not on file  Food Insecurity:   . Worried About Charity fundraiser in the Last Year: Not on file  . Ran Out of Food in the Last Year: Not on file  Transportation Needs:   . Lack of Transportation (Medical): Not on file  . Lack of Transportation (Non-Medical): Not on file  Physical Activity:   . Days of Exercise per Week: Not on file  . Minutes of Exercise per Session: Not on file  Stress:   . Feeling of Stress : Not on file   Social Connections:   . Frequency of Communication with Friends and Family: Not on file  . Frequency of Social Gatherings with Friends and Family: Not on file  . Attends Religious Services: Not on file  . Active Member of Clubs or Organizations: Not on file  . Attends Archivist Meetings: Not on file  . Marital Status: Not on file     Family History: The patient's family history is negative for Colon cancer and Esophageal cancer.  ROS:   Please see the history of present illness.    All other systems reviewed and are negative.  EKGs/Labs/Other Studies Reviewed:  The following studies were reviewed today: CT FFR ANALYSIS  CLINICAL DATA:  Chest pain  FINDINGS: FFRct analysis was performed on the original cardiac CT angiogram dataset. Diagrammatic representation of the FFRct analysis is provided in a separate PDF document in PACS. This dictation was created using the PDF document and an interactive 3D model of the results. 3D model is not available in the EMR/PACS. Normal FFR range is >0.80.  1. Left Main: 0.99; no significant stenosis.  2. Mid LAD: 0.82; no significant stenosis. 3. 1st Diagonal: 0.94; no significant stenosis. 4. LCX: 0.96; no significant stenosis. 5. RCA: 0.98; no significant stenosis.  IMPRESSION: 1.  CT FFR analysis didn't show any significant stenosis.  Eleonore Chiquito, MD   Electronically Signed   By: Eleonore Chiquito   On: 09/19/2019 07:25   CLINICAL DATA:  Chest pain  EXAM: Cardiac/Coronary CTA  TECHNIQUE: The patient was scanned on a [employer]. A 100 kV prospective scan was triggered in the descending thoracic aorta at 111 HU's. Axial non-contrast 3 mm slices were carried out through the heart. The data set was analyzed on a dedicated work station and scored using the Howard. Gantry rotation speed was 250 msecs and collimation was .6 mm. No beta blockade and 0.8 mg of sl NTG was given. The 3D  data set was reconstructed in 5% intervals of the 35-75 % of the R-R cycle. Diastolic phases were analyzed on a dedicated work station using MPR, MIP and VRT modes. The patient received 80 cc of contrast.  FINDINGS: Image quality: excellent.  Noise artifact is: Limited.  Coronary Arteries:  Normal coronary origin.  Left dominance.  Left main: The left main is a short, large caliber vessel with a normal take off from the left coronary cusp that bifurcates to form a left anterior descending artery and a left circumflex artery. There is minimal calcified plaque (<25%).  Left anterior descending artery: The proximal LAD contains mild, mixed density plaque (25-49%). The mid LAD contains moderate non-calcified plaque (50-69%). The distal LAD is patent. The first diagonal contains moderate non-calcified plaque (50-69%).  Left circumflex artery: The LCX is dominant and patent with no evidence of plaque or stenosis. OM1 contains minimal non-calcified plaque (<25%). OM2 and OM3 are patent. The LCX terminates as a patent PDA branch.  Right coronary artery: The RCA is non-dominant with normal take off from the right coronary cusp. There is no evidence of plaque or stenosis.  Right Atrium: Right atrial size is within normal limits.  Right Ventricle: The right ventricular cavity is within normal limits.  Left Atrium: Left atrial size is normal in size with no left atrial appendage filling defect.  Left Ventricle: The ventricular cavity size is within normal limits. There are no stigmata of prior infarction. There is no abnormal filling defect.  Pulmonary arteries: Normal in size without proximal filling defect.  Pulmonary veins: Normal pulmonary venous drainage.  Pericardium: Normal thickness with no significant effusion or calcium present.  Cardiac valves: The aortic valve is trileaflet without significant calcification. The mitral valve is normal structure  without significant calcification.  Aorta: Normal caliber with no significant disease.  Extra-cardiac findings: See attached radiology report for non-cardiac structures.  IMPRESSION: 1. Coronary calcium score of 71. This was 70th percentile for age and sex matched controls.  2. Normal coronary origin with left dominance.  3. Moderate stenoses in the mid LAD and first diagonal (50-69%).  RECOMMENDATIONS: 1. Moderate stenosis. Consider symptom-guided anti-ischemic pharmacotherapy as well  as risk factor modification per guideline directed care. Additional analysis with CT FFR will be submitted.  Eleonore Chiquito, MD   Electronically Signed   By: Eleonore Chiquito   On: 09/18/2019 18:56  IMPRESSIONS    1. Abnormal septal motion. Left ventricular ejection fraction, by  estimation, is 55%%. The left ventricle has low normal function. The left  ventricle has no regional wall motion abnormalities. Left ventricular  diastolic parameters are consistent with  Grade I diastolic dysfunction (impaired relaxation).  2. Right ventricular systolic function is normal. The right ventricular  size is normal.  3. The mitral valve is normal in structure. No evidence of mitral valve  regurgitation. No evidence of mitral stenosis.  4. The aortic valve is tricuspid. Aortic valve regurgitation is not  visualized. No aortic stenosis is present.  5. The inferior vena cava is normal in size with greater than 50%  respiratory variability, suggesting right atrial pressure of 3 mmHg.     Recent Labs: 09/12/2019: BUN 12; Creatinine, Ser 0.71; Potassium 4.4; Sodium 141  Recent Lipid Panel No results found for: CHOL, TRIG, HDL, CHOLHDL, VLDL, LDLCALC, LDLDIRECT  Physical Exam:    VS:  BP (!) 154/82   Pulse 66   Ht 5\' 6"  (1.676 m)   Wt 166 lb 6.4 oz (75.5 kg)   SpO2 95%   BMI 26.86 kg/m     Wt Readings from Last 3 Encounters:  12/19/19 166 lb 6.4 oz (75.5 kg)  11/05/19 169 lb  (76.7 kg)  09/19/19 169 lb 2 oz (76.7 kg)     GEN: Patient is in no acute distress HEENT: Normal NECK: No JVD; No carotid bruits LYMPHATICS: No lymphadenopathy CARDIAC: Hear sounds regular, 2/6 systolic murmur at the apex. RESPIRATORY:  Clear to auscultation without rales, wheezing or rhonchi  ABDOMEN: Soft, non-tender, non-distended MUSCULOSKELETAL:  No edema; No deformity  SKIN: Warm and dry NEUROLOGIC:  Alert and oriented x 3 PSYCHIATRIC:  Normal affect   Signed, Jenean Lindau, MD  12/19/2019 11:03 AM    [place]

## 2019-12-19 NOTE — Patient Instructions (Signed)
  Medication Instructions:  No medication changes. *If you need a refill on your cardiac medications before your next appointment, please call your pharmacy*   Lab Work: Your physician recommends that you have labs done in the office today. Your test included  basic metabolic panel, complete blood count, TSH, liver function and lipids.  If you have labs (blood work) drawn today and your tests are completely normal, you will receive your results only by: . MyChart Message (if you have MyChart) OR . A paper copy in the mail If you have any lab test that is abnormal or we need to change your treatment, we will call you to review the results.   Testing/Procedures: None ordered   Follow-Up: At [facility], you and your health needs are our priority.  As part of our continuing mission to provide you with exceptional heart care, we have created designated Provider Care Teams.  These Care Teams include your primary Cardiologist (physician) and Advanced Practice Providers (APPs -  Physician Assistants and Nurse Practitioners) who all work together to provide you with the care you need, when you need it.  We recommend signing up for the patient portal called "MyChart".  Sign up information is provided on this After Visit Summary.  MyChart is used to connect with patients for Virtual Visits (Telemedicine).  Patients are able to view lab/test results, encounter notes, upcoming appointments, etc.  Non-urgent messages can be sent to your provider as well.   To learn more about what you can do with MyChart, go to [email or web address].    Your next appointment:   4 month(s)  The format for your next appointment:   In Person  Provider:   Rajan Revankar, MD   Other Instructions NA  

## 2020-01-02 DIAGNOSIS — J0100 Acute maxillary sinusitis, unspecified: Secondary | ICD-10-CM | POA: Diagnosis not present

## 2020-01-21 DIAGNOSIS — R339 Retention of urine, unspecified: Secondary | ICD-10-CM | POA: Diagnosis not present

## 2020-01-21 DIAGNOSIS — N952 Postmenopausal atrophic vaginitis: Secondary | ICD-10-CM | POA: Diagnosis not present

## 2020-01-21 DIAGNOSIS — N390 Urinary tract infection, site not specified: Secondary | ICD-10-CM | POA: Diagnosis not present

## 2020-01-21 DIAGNOSIS — Z79899 Other long term (current) drug therapy: Secondary | ICD-10-CM | POA: Diagnosis not present

## 2020-01-21 DIAGNOSIS — N393 Stress incontinence (female) (male): Secondary | ICD-10-CM | POA: Diagnosis not present

## 2020-02-17 ENCOUNTER — Telehealth: Payer: Self-pay | Admitting: Cardiology

## 2020-02-17 DIAGNOSIS — J310 Chronic rhinitis: Secondary | ICD-10-CM | POA: Diagnosis not present

## 2020-02-17 DIAGNOSIS — R5383 Other fatigue: Secondary | ICD-10-CM | POA: Diagnosis not present

## 2020-02-17 DIAGNOSIS — F17210 Nicotine dependence, cigarettes, uncomplicated: Secondary | ICD-10-CM | POA: Diagnosis not present

## 2020-02-17 DIAGNOSIS — Z7189 Other specified counseling: Secondary | ICD-10-CM | POA: Diagnosis not present

## 2020-02-17 DIAGNOSIS — R0600 Dyspnea, unspecified: Secondary | ICD-10-CM | POA: Diagnosis not present

## 2020-02-17 DIAGNOSIS — J4540 Moderate persistent asthma, uncomplicated: Secondary | ICD-10-CM | POA: Diagnosis not present

## 2020-02-17 NOTE — Telephone Encounter (Signed)
Per request, records faxed to [facility] [phone]

## 2020-02-17 NOTE — Telephone Encounter (Signed)
Records faxed.

## 2020-02-17 NOTE — Telephone Encounter (Signed)
[facility] is requesting Records for this patient. Please fax records to [phone]

## 2020-02-18 DIAGNOSIS — A310 Pulmonary mycobacterial infection: Secondary | ICD-10-CM | POA: Diagnosis not present

## 2020-02-18 DIAGNOSIS — R5383 Other fatigue: Secondary | ICD-10-CM | POA: Diagnosis not present

## 2020-02-18 DIAGNOSIS — R0600 Dyspnea, unspecified: Secondary | ICD-10-CM | POA: Diagnosis not present

## 2020-02-18 DIAGNOSIS — J151 Pneumonia due to Pseudomonas: Secondary | ICD-10-CM | POA: Diagnosis not present

## 2020-02-18 DIAGNOSIS — J4540 Moderate persistent asthma, uncomplicated: Secondary | ICD-10-CM | POA: Diagnosis not present

## 2020-02-18 DIAGNOSIS — E038 Other specified hypothyroidism: Secondary | ICD-10-CM | POA: Diagnosis not present

## 2020-02-24 DIAGNOSIS — N393 Stress incontinence (female) (male): Secondary | ICD-10-CM | POA: Diagnosis not present

## 2020-02-24 DIAGNOSIS — R339 Retention of urine, unspecified: Secondary | ICD-10-CM | POA: Diagnosis not present

## 2020-02-24 DIAGNOSIS — N390 Urinary tract infection, site not specified: Secondary | ICD-10-CM | POA: Diagnosis not present

## 2020-02-24 DIAGNOSIS — N952 Postmenopausal atrophic vaginitis: Secondary | ICD-10-CM | POA: Diagnosis not present

## 2020-02-26 ENCOUNTER — Telehealth: Payer: Self-pay | Admitting: Cardiology

## 2020-02-26 NOTE — Telephone Encounter (Signed)
Records faxed per request from [facility].

## 2020-02-27 DIAGNOSIS — R911 Solitary pulmonary nodule: Secondary | ICD-10-CM | POA: Diagnosis not present

## 2020-02-27 DIAGNOSIS — J9811 Atelectasis: Secondary | ICD-10-CM | POA: Diagnosis not present

## 2020-02-27 DIAGNOSIS — A310 Pulmonary mycobacterial infection: Secondary | ICD-10-CM | POA: Diagnosis not present

## 2020-02-27 DIAGNOSIS — J479 Bronchiectasis, uncomplicated: Secondary | ICD-10-CM | POA: Diagnosis not present

## 2020-02-27 DIAGNOSIS — R918 Other nonspecific abnormal finding of lung field: Secondary | ICD-10-CM | POA: Diagnosis not present

## 2020-03-02 DIAGNOSIS — J4540 Moderate persistent asthma, uncomplicated: Secondary | ICD-10-CM | POA: Diagnosis not present

## 2020-03-02 DIAGNOSIS — R5383 Other fatigue: Secondary | ICD-10-CM | POA: Diagnosis not present

## 2020-03-02 DIAGNOSIS — J310 Chronic rhinitis: Secondary | ICD-10-CM | POA: Diagnosis not present

## 2020-03-02 DIAGNOSIS — Z7189 Other specified counseling: Secondary | ICD-10-CM | POA: Diagnosis not present

## 2020-03-02 DIAGNOSIS — R0600 Dyspnea, unspecified: Secondary | ICD-10-CM | POA: Diagnosis not present

## 2020-03-02 DIAGNOSIS — J479 Bronchiectasis, uncomplicated: Secondary | ICD-10-CM | POA: Diagnosis not present

## 2020-04-07 DIAGNOSIS — R339 Retention of urine, unspecified: Secondary | ICD-10-CM | POA: Diagnosis not present

## 2020-04-07 DIAGNOSIS — N393 Stress incontinence (female) (male): Secondary | ICD-10-CM | POA: Diagnosis not present

## 2020-04-07 DIAGNOSIS — N952 Postmenopausal atrophic vaginitis: Secondary | ICD-10-CM | POA: Diagnosis not present

## 2020-04-07 DIAGNOSIS — N390 Urinary tract infection, site not specified: Secondary | ICD-10-CM | POA: Diagnosis not present

## 2020-04-13 ENCOUNTER — Other Ambulatory Visit: Payer: Self-pay

## 2020-04-21 ENCOUNTER — Ambulatory Visit: Payer: PPO | Admitting: Cardiology

## 2020-04-21 ENCOUNTER — Other Ambulatory Visit: Payer: Self-pay

## 2020-04-21 ENCOUNTER — Encounter: Payer: Self-pay | Admitting: Cardiology

## 2020-04-21 VITALS — BP 132/72 | HR 56 | Ht 66.0 in | Wt 161.0 lb

## 2020-04-21 DIAGNOSIS — Z1329 Encounter for screening for other suspected endocrine disorder: Secondary | ICD-10-CM | POA: Diagnosis not present

## 2020-04-21 DIAGNOSIS — E7800 Pure hypercholesterolemia, unspecified: Secondary | ICD-10-CM | POA: Diagnosis not present

## 2020-04-21 DIAGNOSIS — E782 Mixed hyperlipidemia: Secondary | ICD-10-CM

## 2020-04-21 DIAGNOSIS — I2510 Atherosclerotic heart disease of native coronary artery without angina pectoris: Secondary | ICD-10-CM | POA: Diagnosis not present

## 2020-04-21 DIAGNOSIS — Z87891 Personal history of nicotine dependence: Secondary | ICD-10-CM

## 2020-04-21 NOTE — Patient Instructions (Signed)

## 2020-04-21 NOTE — Progress Notes (Signed)
Cardiology Office Note:    Date:  04/21/2020   ID:  Laurie Barr, DOB 08/28/1950, MRN [id number]  PCP:  Greig Right, MD  Cardiologist:  Jenean Lindau, MD   Referring MD: Greig Right, MD    ASSESSMENT:    1. Coronary artery disease involving native coronary artery of native heart without angina pectoris   2. Hypercholesterolemia   3. Ex-smoker   4. Mixed dyslipidemia    PLAN:    In order of problems listed above:  1. Coronary artery disease: Secondary prevention stressed with the patient.  Importance of compliance with diet medication stressed and she vocalized understanding.  She was advised to walk half an hour a day on a daily basis and she promises to do so. 2. Essential hypertension: Blood pressure stable and diet was emphasized. 3. Mixed dyslipidemia: Lipids were reviewed she request blood work and she is fasting and we will do blood work including fasting lipids and get back to her about this. 4. Patient will be seen in follow-up appointment in 6 months or earlier if the patient has any concerns    Medication Adjustments/Labs and Tests Ordered: Current medicines are reviewed at length with the patient today.  Concerns regarding medicines are outlined above.  No orders of the defined types were placed in this encounter.  No orders of the defined types were placed in this encounter.    No chief complaint on file.    History of Present Illness:    Laurie Barr is a 70 y.o. female.  Patient has past medical history of coronary artery disease, essential hypertension and dyslipidemia.  She denies any problems at this time and takes care of activities of daily living.  No chest pain orthopnea or PND.  Unfortunately she has resumed smoking though she smokes very little.  At the time of my evaluation, the patient is alert awake oriented and in no distress.  She does not exercise on a regular basis.  She mentions to me that 3 months ago she had an episode of  chest tightness and she used aspirin with relief.  She did not use nitroglycerin.  I educated her about nitroglycerin use.  Past Medical History:  Diagnosis Date  . Achilles tendinitis of left lower extremity 08/22/2019  . 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 Anxiety   . Asthma   . Bronchiectasis ([place]) 10/01/2015  . CAD (coronary artery disease) 12/19/2019  . Chest tightness 08/12/2019  . COPD (chronic obstructive pulmonary disease) ([place]) 10/01/2015  . Dyspnea on exertion 08/12/2019  . Endometriosis   . Ex-smoker 08/12/2019  . GERD (gastroesophageal reflux disease)   . History of colonic polyps   . Hypercholesterolemia   . 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 IBS (irritable bowel syndrome)   . Mixed dyslipidemia 08/12/2019  . 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 Nodule of right lung 10/01/2015  . Plantar callus 05/05/2016  . Plantar fasciitis 08/22/2019  . Pneumonia   . Tailor's bunion of left foot 05/05/2016  . Tightness of heel cord, right 08/22/2019    Past Surgical History:  Procedure Laterality Date  . ABDOMINAL HYSTERECTOMY    . COLONOSCOPY  11/04/2013   Colonic polyp status post polypectomy. Mild sigmoid diverticulosis  . CYSTOSCOPY  11/17/2016  . ELBOW SURGERY Left    removed bone due to fall/ Dr. Lorin Mercy  . ESOPHAGOGASTRODUODENOSCOPY  11/19/2015   Mild gastritis. Transient hiatal hernia. Otherwise, normal EGD  . HAND SURGERY Right  removed cyst  . LAPAROTOMY     multiple due to endometriosis fibrous tumors and adhesions  . STOMACH SURGERY     Tumor removed size of grapefruit    Current Medications: Current Meds  Medication Sig  . albuterol (PROVENTIL HFA;VENTOLIN HFA) 108 (90 Base) MCG/ACT inhaler Inhale 2 puffs into the lungs every 6 (six) hours as needed for wheezing or shortness of breath.  Marland Kitchen aspirin 81 MG tablet Take 81 mg by mouth daily as needed.   Marland Kitchen atorvastatin (LIPITOR) 40 MG tablet Take 40 mg by mouth daily.  . baclofen (LIORESAL) 10  MG tablet Take 10 mg by mouth 2 (two) times daily as needed for muscle spasms.  . fluticasone (FLONASE) 50 MCG/ACT nasal spray Place 2 sprays into both nostrils.  . meloxicam (MOBIC) 7.5 MG tablet Take 7.5 mg by mouth daily.  . montelukast (SINGULAIR) 10 MG tablet Take 10 mg by mouth daily.  Marland Kitchen omeprazole (PRILOSEC) 20 MG capsule Take 20 mg by mouth daily.  . pantoprazole (PROTONIX) 40 MG tablet Take 1 tablet (40 mg total) by mouth daily.  . tamsulosin (FLOMAX) 0.4 MG CAPS capsule Take 0.4 mg by mouth daily.  . TRELEGY ELLIPTA 100-62.5-25 MCG/INH AEPB Inhale 1 puff into the lungs daily.  Marland Kitchen trimethoprim (TRIMPEX) 100 MG tablet Take 100 mg by mouth daily.     Allergies:   Patient has no known allergies.   Social History   Socioeconomic History  . Marital status: Unknown    Spouse name: Not on file  . Number of children: Not on file  . Years of education: Not on file  . Highest education level: Not on file  Occupational History  . Not on file  Tobacco Use  . Smoking status: Former Smoker    Quit date: 08/12/2015    Years since quitting: 4.6  . Smokeless tobacco: Never Used  Vaping Use  . Vaping Use: Never used  Substance and Sexual Activity  . Alcohol use: Not Currently  . Drug use: Never  . Sexual activity: Not on file  Other Topics Concern  . Not on file  Social History Narrative  . Not on file   Social Determinants of Health   Financial Resource Strain: Not on file  Food Insecurity: Not on file  Transportation Needs: Not on file  Physical Activity: Not on file  Stress: Not on file  Social Connections: Not on file     Family History: The patient's family history is negative for Colon cancer and Esophageal cancer.  ROS:   Please see the history of present illness.    All other systems reviewed and are negative.  EKGs/Labs/Other Studies Reviewed:    The following studies were reviewed today: I discussed my findings with the patient in extensive length   Recent  Labs: 12/19/2019: ALT 21; BUN 9; Creatinine, Ser 0.76; Hemoglobin 13.6; Platelets 211; Potassium 4.4; Sodium 142; TSH 2.130  Recent Lipid Panel    Component Value Date/Time   CHOL 180 12/19/2019 1118   TRIG 122 12/19/2019 1118   HDL 65 12/19/2019 1118   CHOLHDL 2.8 12/19/2019 1118   LDLCALC 94 12/19/2019 1118    Physical Exam:    VS:  BP 132/72   Pulse (!) 56   Ht 5\' 6"  (1.676 m)   Wt 161 lb (73 kg)   SpO2 94%   BMI 25.99 kg/m     Wt Readings from Last 3 Encounters:  04/21/20 161 lb (73 kg)  12/19/19 166 lb  6.4 oz (75.5 kg)  11/05/19 169 lb (76.7 kg)     GEN: Patient is in no acute distress HEENT: Normal NECK: No JVD; No carotid bruits LYMPHATICS: No lymphadenopathy CARDIAC: Hear sounds regular, 2/6 systolic murmur at the apex. RESPIRATORY:  Clear to auscultation without rales, wheezing or rhonchi  ABDOMEN: Soft, non-tender, non-distended MUSCULOSKELETAL:  No edema; No deformity  SKIN: Warm and dry NEUROLOGIC:  Alert and oriented x 3 PSYCHIATRIC:  Normal affect   Signed, Jenean Lindau, MD  04/21/2020 11:11 AM    [place]

## 2020-04-22 LAB — CBC WITH DIFFERENTIAL/PLATELET
Basophils Absolute: 0 10*3/uL (ref 0.0–0.2)
Basos: 1 %
EOS (ABSOLUTE): 0.2 10*3/uL (ref 0.0–0.4)
Eos: 3 %
Hematocrit: 41.9 % (ref 34.0–46.6)
Hemoglobin: 13.8 g/dL (ref 11.1–15.9)
Immature Grans (Abs): 0 10*3/uL (ref 0.0–0.1)
Immature Granulocytes: 0 %
Lymphocytes Absolute: 1.1 10*3/uL (ref 0.7–3.1)
Lymphs: 17 %
MCH: 30.7 pg (ref 26.6–33.0)
MCHC: 32.9 g/dL (ref 31.5–35.7)
MCV: 93 fL (ref 79–97)
Monocytes Absolute: 0.4 10*3/uL (ref 0.1–0.9)
Monocytes: 7 %
Neutrophils Absolute: 4.6 10*3/uL (ref 1.4–7.0)
Neutrophils: 72 %
Platelets: 218 10*3/uL (ref 150–450)
RBC: 4.5 x10E6/uL (ref 3.77–5.28)
RDW: 12.9 % (ref 11.7–15.4)
WBC: 6.3 10*3/uL (ref 3.4–10.8)

## 2020-04-22 LAB — BASIC METABOLIC PANEL
BUN/Creatinine Ratio: 13 (ref 12–28)
BUN: 10 mg/dL (ref 8–27)
CO2: 24 mmol/L (ref 20–29)
Calcium: 9 mg/dL (ref 8.7–10.3)
Chloride: 103 mmol/L (ref 96–106)
Creatinine, Ser: 0.77 mg/dL (ref 0.57–1.00)
GFR calc Af Amer: 91 mL/min/{1.73_m2} (ref >59)
GFR calc non Af Amer: 79 mL/min/{1.73_m2} (ref >59)
Glucose: 92 mg/dL (ref 65–99)
Potassium: 4.5 mmol/L (ref 3.5–5.2)
Sodium: 141 mmol/L (ref 134–144)

## 2020-04-22 LAB — LIPID PANEL
Chol/HDL Ratio: 2.6 ratio (ref 0.0–4.4)
Cholesterol, Total: 156 mg/dL (ref 100–199)
HDL: 60 mg/dL (ref >39)
LDL Chol Calc (NIH): 71 mg/dL (ref 0–99)
Triglycerides: 144 mg/dL (ref 0–149)
VLDL Cholesterol Cal: 25 mg/dL (ref 5–40)

## 2020-04-22 LAB — HEPATIC FUNCTION PANEL
ALT: 24 IU/L (ref 0–32)
AST: 32 IU/L (ref 0–40)
Albumin: 4.4 g/dL (ref 3.8–4.8)
Alkaline Phosphatase: 79 IU/L (ref 44–121)
Bilirubin Total: 1 mg/dL (ref 0.0–1.2)
Bilirubin, Direct: 0.25 mg/dL (ref 0.00–0.40)
Total Protein: 6.4 g/dL (ref 6.0–8.5)

## 2020-04-22 LAB — TSH: TSH: 1.52 u[IU]/mL (ref 0.450–4.500)

## 2020-05-09 DIAGNOSIS — J069 Acute upper respiratory infection, unspecified: Secondary | ICD-10-CM | POA: Diagnosis not present

## 2020-05-09 DIAGNOSIS — R519 Headache, unspecified: Secondary | ICD-10-CM | POA: Diagnosis not present

## 2020-05-25 DIAGNOSIS — S2232XA Fracture of one rib, left side, initial encounter for closed fracture: Secondary | ICD-10-CM | POA: Diagnosis not present

## 2020-05-25 DIAGNOSIS — M542 Cervicalgia: Secondary | ICD-10-CM | POA: Diagnosis not present

## 2020-05-25 DIAGNOSIS — R112 Nausea with vomiting, unspecified: Secondary | ICD-10-CM | POA: Diagnosis not present

## 2020-05-25 DIAGNOSIS — S060X0A Concussion without loss of consciousness, initial encounter: Secondary | ICD-10-CM | POA: Diagnosis not present

## 2020-05-25 DIAGNOSIS — S2243XA Multiple fractures of ribs, bilateral, initial encounter for closed fracture: Secondary | ICD-10-CM | POA: Diagnosis not present

## 2020-05-25 DIAGNOSIS — J449 Chronic obstructive pulmonary disease, unspecified: Secondary | ICD-10-CM | POA: Diagnosis not present

## 2020-05-25 DIAGNOSIS — K219 Gastro-esophageal reflux disease without esophagitis: Secondary | ICD-10-CM | POA: Diagnosis not present

## 2020-05-25 DIAGNOSIS — Z7982 Long term (current) use of aspirin: Secondary | ICD-10-CM | POA: Diagnosis not present

## 2020-05-25 DIAGNOSIS — R42 Dizziness and giddiness: Secondary | ICD-10-CM | POA: Diagnosis not present

## 2020-05-25 DIAGNOSIS — M546 Pain in thoracic spine: Secondary | ICD-10-CM | POA: Diagnosis not present

## 2020-05-25 DIAGNOSIS — S2231XA Fracture of one rib, right side, initial encounter for closed fracture: Secondary | ICD-10-CM | POA: Diagnosis not present

## 2020-05-25 DIAGNOSIS — M1990 Unspecified osteoarthritis, unspecified site: Secondary | ICD-10-CM | POA: Diagnosis not present

## 2020-05-25 DIAGNOSIS — Z043 Encounter for examination and observation following other accident: Secondary | ICD-10-CM | POA: Diagnosis not present

## 2020-05-25 DIAGNOSIS — F17200 Nicotine dependence, unspecified, uncomplicated: Secondary | ICD-10-CM | POA: Diagnosis not present

## 2020-05-25 DIAGNOSIS — Z79899 Other long term (current) drug therapy: Secondary | ICD-10-CM | POA: Diagnosis not present

## 2020-05-25 DIAGNOSIS — E7800 Pure hypercholesterolemia, unspecified: Secondary | ICD-10-CM | POA: Diagnosis not present

## 2020-06-01 DIAGNOSIS — J4540 Moderate persistent asthma, uncomplicated: Secondary | ICD-10-CM | POA: Diagnosis not present

## 2020-06-01 DIAGNOSIS — J310 Chronic rhinitis: Secondary | ICD-10-CM | POA: Diagnosis not present

## 2020-06-01 DIAGNOSIS — R0600 Dyspnea, unspecified: Secondary | ICD-10-CM | POA: Diagnosis not present

## 2020-06-01 DIAGNOSIS — J479 Bronchiectasis, uncomplicated: Secondary | ICD-10-CM | POA: Diagnosis not present

## 2020-06-01 DIAGNOSIS — R5383 Other fatigue: Secondary | ICD-10-CM | POA: Diagnosis not present

## 2020-06-03 ENCOUNTER — Telehealth: Payer: Self-pay | Admitting: Cardiology

## 2020-06-03 ENCOUNTER — Ambulatory Visit (INDEPENDENT_AMBULATORY_CARE_PROVIDER_SITE_OTHER): Payer: PPO

## 2020-06-03 DIAGNOSIS — R002 Palpitations: Secondary | ICD-10-CM | POA: Diagnosis not present

## 2020-06-03 DIAGNOSIS — I4729 Other ventricular tachycardia: Secondary | ICD-10-CM

## 2020-06-03 DIAGNOSIS — I472 Ventricular tachycardia: Secondary | ICD-10-CM

## 2020-06-03 NOTE — Telephone Encounter (Signed)
Spoke with pt who states that she has had 2 episodes in the past week at night of her heart rate increasing to 230. Pt states that at the time of the episodes she has chest tightness/pressure with radiation into the shoulder and arm. Pt states that she has shortness of breath and dizziness. Pt states that the episodes usually last 5-10 mins and slows down after taking 3 low dose aspirin. Pt states that she has difficulty sleeping so she is awake at the time it occurs. She recently fell and has 2 broken ribs and is grieving as her son recently died. I advised her to call 911 and go to the ED if it happens again. How do you advise?

## 2020-06-03 NOTE — Telephone Encounter (Signed)
I agree with your advice.  Please put ZIO AT as soon as possible

## 2020-06-03 NOTE — Addendum Note (Signed)
Addended by: Truddie Hidden on: 06/03/2020 03:13 PM   Modules accepted: Orders

## 2020-06-03 NOTE — Telephone Encounter (Signed)
Pt will come to the office for zio at.

## 2020-06-03 NOTE — Telephone Encounter (Signed)
STAT if HR is under 50 or over 120 (normal HR is 60-100 beats per minute)  1) What is your heart rate? 60 now  2) Do you have a log of your heart rate readings (document readings)? Got up to 230 last night  3) Do you have any other symptoms? Shoulder and left arm pain  Patient states twice in the middle of the night she has had shoulder pain that goes from across her shoulder blades to her left arm. She states her HR both times went up to 230. She states it is now 68. She states when it happens she gets SOB and states if feels like "every breathe feels like it kills her'. She states she takes an aspirin and it helps. Patient was not SOB on the phone. She states she has a little bit of shoulder pain.

## 2020-06-04 DIAGNOSIS — R002 Palpitations: Secondary | ICD-10-CM | POA: Diagnosis not present

## 2020-06-17 DIAGNOSIS — W19XXXA Unspecified fall, initial encounter: Secondary | ICD-10-CM | POA: Diagnosis not present

## 2020-06-17 DIAGNOSIS — S20212A Contusion of left front wall of thorax, initial encounter: Secondary | ICD-10-CM | POA: Diagnosis not present

## 2020-06-17 DIAGNOSIS — R599 Enlarged lymph nodes, unspecified: Secondary | ICD-10-CM | POA: Diagnosis not present

## 2020-06-17 DIAGNOSIS — J0100 Acute maxillary sinusitis, unspecified: Secondary | ICD-10-CM | POA: Diagnosis not present

## 2020-06-29 DIAGNOSIS — F17210 Nicotine dependence, cigarettes, uncomplicated: Secondary | ICD-10-CM | POA: Diagnosis not present

## 2020-06-29 DIAGNOSIS — R0600 Dyspnea, unspecified: Secondary | ICD-10-CM | POA: Diagnosis not present

## 2020-06-29 DIAGNOSIS — J479 Bronchiectasis, uncomplicated: Secondary | ICD-10-CM | POA: Diagnosis not present

## 2020-06-29 DIAGNOSIS — J310 Chronic rhinitis: Secondary | ICD-10-CM | POA: Diagnosis not present

## 2020-06-29 DIAGNOSIS — J4540 Moderate persistent asthma, uncomplicated: Secondary | ICD-10-CM | POA: Diagnosis not present

## 2020-06-29 DIAGNOSIS — R5383 Other fatigue: Secondary | ICD-10-CM | POA: Diagnosis not present

## 2020-07-07 ENCOUNTER — Telehealth (HOSPITAL_COMMUNITY): Payer: Self-pay | Admitting: *Deleted

## 2020-07-07 NOTE — Telephone Encounter (Signed)
Left message on voicemail per DPR in reference to upcoming appointment scheduled on 07/14/20 at 7:45 with detailed instructions given per Myocardial Perfusion Study Information Sheet for the test. LM to arrive 15 minutes early, and that it is imperative to arrive on time for appointment to keep from having the test rescheduled. If you need to cancel or reschedule your appointment, please call the office within 24 hours of your appointment. Failure to do so may result in a cancellation of your appointment, and a $50 no show fee. Phone number given for call back for any questions.

## 2020-07-14 ENCOUNTER — Ambulatory Visit (INDEPENDENT_AMBULATORY_CARE_PROVIDER_SITE_OTHER): Payer: PPO

## 2020-07-14 ENCOUNTER — Other Ambulatory Visit: Payer: Self-pay

## 2020-07-14 DIAGNOSIS — I4729 Other ventricular tachycardia: Secondary | ICD-10-CM

## 2020-07-14 DIAGNOSIS — I472 Ventricular tachycardia: Secondary | ICD-10-CM | POA: Diagnosis not present

## 2020-07-14 LAB — MYOCARDIAL PERFUSION IMAGING
LV dias vol: 82 mL (ref 46–106)
LV sys vol: 28 mL
Peak HR: 77 {beats}/min
Rest HR: 60 {beats}/min
SDS: 3
SRS: 3
SSS: 6
TID: 0.96

## 2020-07-14 MED ORDER — TECHNETIUM TC 99M TETROFOSMIN IV KIT: 32.3000 | PACK | Freq: Once | INTRAVENOUS | Status: DC | PRN

## 2020-07-14 MED ORDER — TECHNETIUM TC 99M TETROFOSMIN IV KIT
10.9000 | PACK | Freq: Once | INTRAVENOUS | Status: AC | PRN
  Administered 2020-07-14: 10.9 via INTRAVENOUS

## 2020-07-14 MED ORDER — REGADENOSON 0.4 MG/5ML IV SOLN: 0.4000 mg | Freq: Once | INTRAVENOUS | Status: DC

## 2020-07-14 MED ORDER — REGADENOSON 0.4 MG/5ML IV SOLN
0.4000 mg | Freq: Once | INTRAVENOUS | Status: AC
  Administered 2020-07-14: 0.4 mg via INTRAVENOUS

## 2020-07-14 MED ORDER — TECHNETIUM TC 99M TETROFOSMIN IV KIT
32.4000 | PACK | Freq: Once | INTRAVENOUS | Status: AC | PRN
  Administered 2020-07-14: 32.4 via INTRAVENOUS

## 2020-07-14 MED ORDER — TECHNETIUM TC 99M TETROFOSMIN IV KIT
11.0000 | PACK | Freq: Once | INTRAVENOUS | Status: DC | PRN
Start: 2020-07-14 — End: 2020-07-14

## 2020-07-15 LAB — MAGNESIUM: Magnesium: 2.3 mg/dL (ref 1.6–2.3)

## 2020-07-15 LAB — BASIC METABOLIC PANEL
BUN/Creatinine Ratio: 16 (ref 12–28)
BUN: 12 mg/dL (ref 8–27)
CO2: 24 mmol/L (ref 20–29)
Calcium: 9 mg/dL (ref 8.7–10.3)
Chloride: 103 mmol/L (ref 96–106)
Creatinine, Ser: 0.74 mg/dL (ref 0.57–1.00)
Glucose: 98 mg/dL (ref 65–99)
Potassium: 4.1 mmol/L (ref 3.5–5.2)
Sodium: 140 mmol/L (ref 134–144)
eGFR: 88 mL/min/{1.73_m2} (ref >59)

## 2020-07-15 LAB — TSH: TSH: 2.45 u[IU]/mL (ref 0.450–4.500)

## 2020-07-17 DIAGNOSIS — H81399 Other peripheral vertigo, unspecified ear: Secondary | ICD-10-CM | POA: Diagnosis not present

## 2020-07-17 DIAGNOSIS — R300 Dysuria: Secondary | ICD-10-CM | POA: Diagnosis not present

## 2020-07-17 DIAGNOSIS — N3090 Cystitis, unspecified without hematuria: Secondary | ICD-10-CM | POA: Diagnosis not present

## 2020-07-17 DIAGNOSIS — R112 Nausea with vomiting, unspecified: Secondary | ICD-10-CM | POA: Diagnosis not present

## 2020-07-23 DIAGNOSIS — E669 Obesity, unspecified: Secondary | ICD-10-CM | POA: Diagnosis not present

## 2020-07-23 DIAGNOSIS — F411 Generalized anxiety disorder: Secondary | ICD-10-CM | POA: Diagnosis not present

## 2020-07-23 DIAGNOSIS — Z6830 Body mass index (BMI) 30.0-30.9, adult: Secondary | ICD-10-CM | POA: Diagnosis not present

## 2020-07-23 DIAGNOSIS — E7800 Pure hypercholesterolemia, unspecified: Secondary | ICD-10-CM | POA: Diagnosis not present

## 2020-07-23 DIAGNOSIS — M810 Age-related osteoporosis without current pathological fracture: Secondary | ICD-10-CM | POA: Diagnosis not present

## 2020-07-23 DIAGNOSIS — I2584 Coronary atherosclerosis due to calcified coronary lesion: Secondary | ICD-10-CM | POA: Diagnosis not present

## 2020-08-12 DIAGNOSIS — J310 Chronic rhinitis: Secondary | ICD-10-CM | POA: Diagnosis not present

## 2020-08-12 DIAGNOSIS — J479 Bronchiectasis, uncomplicated: Secondary | ICD-10-CM | POA: Diagnosis not present

## 2020-08-12 DIAGNOSIS — R5383 Other fatigue: Secondary | ICD-10-CM | POA: Diagnosis not present

## 2020-08-12 DIAGNOSIS — F17210 Nicotine dependence, cigarettes, uncomplicated: Secondary | ICD-10-CM | POA: Diagnosis not present

## 2020-08-12 DIAGNOSIS — J4540 Moderate persistent asthma, uncomplicated: Secondary | ICD-10-CM | POA: Diagnosis not present

## 2020-08-12 DIAGNOSIS — R0600 Dyspnea, unspecified: Secondary | ICD-10-CM | POA: Diagnosis not present

## 2020-08-13 DIAGNOSIS — R339 Retention of urine, unspecified: Secondary | ICD-10-CM | POA: Diagnosis not present

## 2020-08-13 DIAGNOSIS — N393 Stress incontinence (female) (male): Secondary | ICD-10-CM | POA: Diagnosis not present

## 2020-08-13 DIAGNOSIS — N390 Urinary tract infection, site not specified: Secondary | ICD-10-CM | POA: Diagnosis not present

## 2020-08-13 DIAGNOSIS — N952 Postmenopausal atrophic vaginitis: Secondary | ICD-10-CM | POA: Diagnosis not present

## 2020-08-31 DIAGNOSIS — R197 Diarrhea, unspecified: Secondary | ICD-10-CM | POA: Diagnosis not present

## 2020-10-05 DIAGNOSIS — F17200 Nicotine dependence, unspecified, uncomplicated: Secondary | ICD-10-CM | POA: Diagnosis not present

## 2020-10-05 DIAGNOSIS — E669 Obesity, unspecified: Secondary | ICD-10-CM | POA: Diagnosis not present

## 2020-10-05 DIAGNOSIS — K219 Gastro-esophageal reflux disease without esophagitis: Secondary | ICD-10-CM | POA: Diagnosis not present

## 2020-10-05 DIAGNOSIS — E7800 Pure hypercholesterolemia, unspecified: Secondary | ICD-10-CM | POA: Diagnosis not present

## 2020-10-05 DIAGNOSIS — Z6831 Body mass index (BMI) 31.0-31.9, adult: Secondary | ICD-10-CM | POA: Diagnosis not present

## 2020-10-05 DIAGNOSIS — Z79899 Other long term (current) drug therapy: Secondary | ICD-10-CM | POA: Diagnosis not present

## 2020-10-05 DIAGNOSIS — M810 Age-related osteoporosis without current pathological fracture: Secondary | ICD-10-CM | POA: Diagnosis not present

## 2020-10-05 DIAGNOSIS — F411 Generalized anxiety disorder: Secondary | ICD-10-CM | POA: Diagnosis not present

## 2020-10-09 DIAGNOSIS — J4540 Moderate persistent asthma, uncomplicated: Secondary | ICD-10-CM | POA: Diagnosis not present

## 2020-10-09 DIAGNOSIS — J310 Chronic rhinitis: Secondary | ICD-10-CM | POA: Diagnosis not present

## 2020-10-09 DIAGNOSIS — J479 Bronchiectasis, uncomplicated: Secondary | ICD-10-CM | POA: Diagnosis not present

## 2020-10-09 DIAGNOSIS — F17210 Nicotine dependence, cigarettes, uncomplicated: Secondary | ICD-10-CM | POA: Diagnosis not present

## 2020-10-09 DIAGNOSIS — R5383 Other fatigue: Secondary | ICD-10-CM | POA: Diagnosis not present

## 2020-10-09 DIAGNOSIS — R0600 Dyspnea, unspecified: Secondary | ICD-10-CM | POA: Diagnosis not present

## 2020-10-19 ENCOUNTER — Other Ambulatory Visit: Payer: Self-pay

## 2020-10-20 ENCOUNTER — Ambulatory Visit: Payer: PPO | Admitting: Cardiology

## 2020-10-20 ENCOUNTER — Other Ambulatory Visit: Payer: Self-pay

## 2020-10-20 ENCOUNTER — Encounter: Payer: Self-pay | Admitting: Cardiology

## 2020-10-20 VITALS — BP 134/72 | HR 46 | Ht 62.0 in | Wt 160.2 lb

## 2020-10-20 DIAGNOSIS — I2510 Atherosclerotic heart disease of native coronary artery without angina pectoris: Secondary | ICD-10-CM

## 2020-10-20 DIAGNOSIS — F17210 Nicotine dependence, cigarettes, uncomplicated: Secondary | ICD-10-CM | POA: Insufficient documentation

## 2020-10-20 DIAGNOSIS — E782 Mixed hyperlipidemia: Secondary | ICD-10-CM | POA: Diagnosis not present

## 2020-10-20 DIAGNOSIS — I208 Other forms of angina pectoris: Secondary | ICD-10-CM | POA: Diagnosis not present

## 2020-10-20 DIAGNOSIS — I2089 Other forms of angina pectoris: Secondary | ICD-10-CM

## 2020-10-20 HISTORY — DX: Nicotine dependence, cigarettes, uncomplicated: F17.210

## 2020-10-20 HISTORY — DX: Other forms of angina pectoris: I20.8

## 2020-10-20 HISTORY — DX: Other forms of angina pectoris: I20.89

## 2020-10-20 NOTE — Progress Notes (Signed)
Cardiology Office Note:    Date:  10/20/2020   ID:  Laurie Barr, DOB June 04, 1950, MRN [id number]  PCP:  Greig Right, MD  Cardiologist:  Jenean Lindau, MD   Referring MD: Greig Right, MD    ASSESSMENT:    1. Coronary artery disease involving native coronary artery of native heart without angina pectoris   2. Mixed dyslipidemia   3. Stable angina pectoris ([place])   4. Cigarette smoker    PLAN:    In order of problems listed above:  Coronary artery disease: EKG done today reveals sinus rhythm and left bundle branch block and bradycardia nonspecific ST-T changes.  I reviewed monitor report with her at length.  She has stable angina pectoris.  I discussed with her the possibility of further evaluation with coronary angiography and she is not keen on it now.  She does not exercise on a regular basis.  I told her to walk at least half an hour a day on a daily basis and she promises to do so. Essential hypertension: Blood pressure stable and diet was emphasized. Mixed dyslipidemia: Lipids were reviewed diet emphasized and exercise stressed. Cigarette smoker: I spent 5 minutes with the patient discussing solely about smoking. Smoking cessation was counseled. I suggested to the patient also different medications and pharmacological interventions. Patient is keen to try stopping on its own at this time. He will get back to me if he needs any further assistance in this matter. Patient will be seen in follow-up appointment in 1 mo or earlier if the patient has any concerns    Medication Adjustments/Labs and Tests Ordered: Current medicines are reviewed at length with the patient today.  Concerns regarding medicines are outlined above.  Orders Placed This Encounter  Procedures   EKG 12-Lead   No orders of the defined types were placed in this encounter.    No chief complaint on file.    History of Present Illness:    Laurie Barr is a 70 y.o. female.  Patient has  past medical history of coronary artery disease, essential hypertension and dyslipidemia.  Unfortunately she continues to smoke.  She complains of occasional chest tightness.  This may or may not be related to exertion.  She has not used nitroglycerin but takes aspirin on a regular basis.  At the time of my evaluation, the patient is alert awake oriented and in no distress.  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   Anxiety    Asthma    Bronchiectasis ([place]) 10/01/2015   CAD (coronary artery disease) 12/19/2019   Chest tightness 08/12/2019   COPD (chronic obstructive pulmonary disease) ([place]) 10/01/2015   Dyspnea on exertion 08/12/2019   Endometriosis    Ex-smoker 08/12/2019   GERD (gastroesophageal reflux disease)    History of colonic polyps    Hypercholesterolemia    Hypertrophy of bone, left ankle and foot 05/05/2016   IBS (irritable bowel syndrome)    Mixed dyslipidemia 08/12/2019   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 Mild sigmoid diverticulosis  CYSTOSCOPY  11/17/2016   ELBOW SURGERY Left    removed bone due to fall/ Dr. Lorin Mercy   ESOPHAGOGASTRODUODENOSCOPY  11/19/2015   Mild gastritis. Transient hiatal hernia. Otherwise, normal EGD   HAND SURGERY Right    removed cyst   LAPAROTOMY     multiple due to endometriosis fibrous tumors and adhesions   STOMACH SURGERY     Tumor removed size of grapefruit    Current Medications: Current Meds  Medication Sig   albuterol (PROVENTIL HFA;VENTOLIN HFA) 108 (90 Base) MCG/ACT inhaler Inhale 2 puffs into the lungs  every 6 (six) hours as needed for wheezing or shortness of breath.   aspirin 81 MG tablet Take 81 mg by mouth daily as needed for pain.   baclofen (LIORESAL) 10 MG tablet Take 10 mg by mouth 2 (two) times daily as needed for muscle spasms.   citalopram (CELEXA) 10 MG tablet Take 10 mg by mouth daily.   fluticasone (FLONASE) 50 MCG/ACT nasal spray Place 2 sprays into both nostrils.   montelukast (SINGULAIR) 10 MG tablet Take 10 mg by mouth daily.   nitroGLYCERIN (NITROSTAT) 0.4 MG SL tablet Place 0.4 mg under the tongue every 5 (five) minutes as needed for chest pain.   omeprazole (PRILOSEC) 20 MG capsule Take 20 mg by mouth daily.   pantoprazole (PROTONIX) 40 MG tablet Take 1 tablet (40 mg total) by mouth daily.   rosuvastatin (CRESTOR) 40 MG tablet Take 40 mg by mouth daily.   tamsulosin (FLOMAX) 0.4 MG CAPS capsule Take 0.4 mg by mouth daily.   trimethoprim (TRIMPEX) 100 MG tablet Take 100 mg by mouth daily.     Allergies:   Patient has no known allergies.   Social History   Socioeconomic History   Marital status: Unknown    Spouse name: Not on file   Number of children: Not on file   Years of education: Not on file   Highest education level: Not on file  Occupational History   Not on file  Tobacco Use   Smoking status: Former    Types: Cigarettes    Quit date: 08/12/2015    Years since quitting: 5.1   Smokeless tobacco: Never  Vaping Use   Vaping Use: Never used  Substance and Sexual Activity   Alcohol use: Not Currently   Drug use: Never   Sexual activity: Not on file  Other Topics Concern   Not on file  Social History Narrative   Not on file   Social Determinants of Health   Financial Resource Strain: Not on file  Food Insecurity: Not on file  Transportation Needs: Not on file  Physical Activity: Not on file  Stress: Not on file  Social Connections: Not on file     Family History: The patient's family history includes Cancer in her father; Diabetes in her  brother and sister; Hypertension in her brother, father, mother, and sister. There is no history of Colon cancer, Esophageal cancer, or Heart disease.  ROS:   Please see the history of present illness.    All other systems reviewed and are negative.  EKGs/Labs/Other Studies Reviewed:    The following studies were reviewed today: I discussed CT coronary angiography report from the past and stress test and monitoring report with the patient at length.   Recent Labs: 04/21/2020: ALT 24; Hemoglobin 13.8; Platelets 218 07/14/2020: BUN 12; Creatinine, Ser 0.74; Magnesium 2.3; Potassium 4.1; Sodium 140; TSH 2.450  Recent Lipid Panel    Component Value Date/Time  CHOL 156 04/21/2020 1129   TRIG 144 04/21/2020 1129   HDL 60 04/21/2020 1129   CHOLHDL 2.6 04/21/2020 1129   LDLCALC 71 04/21/2020 1129    Physical Exam:    VS:  BP 134/72   Pulse (!) 46   Ht '5\' 2"'$  (1.575 m)   Wt 160 lb 3.2 oz (72.7 kg)   SpO2 95%   BMI 29.30 kg/m     Wt Readings from Last 3 Encounters:  10/20/20 160 lb 3.2 oz (72.7 kg)  07/14/20 161 lb (73 kg)  04/21/20 161 lb (73 kg)     GEN: Patient is in no acute distress HEENT: Normal NECK: No JVD; No carotid bruits LYMPHATICS: No lymphadenopathy CARDIAC: Hear sounds regular, 2/6 systolic murmur at the apex. RESPIRATORY:  Clear to auscultation without rales, wheezing or rhonchi  ABDOMEN: Soft, non-tender, non-distended MUSCULOSKELETAL:  No edema; No deformity  SKIN: Warm and dry NEUROLOGIC:  Alert and oriented x 3 PSYCHIATRIC:  Normal affect   Signed, Jenean Lindau, MD  10/20/2020 9:35 AM    [facility]

## 2020-10-20 NOTE — Patient Instructions (Signed)
Medication Instructions:  No medication changes. *If you need a refill on your cardiac medications before your next appointment, please call your pharmacy*   Lab Work: None ordered If you have labs (blood work) drawn today and your tests are completely normal, you will receive your results only by: . MyChart Message (if you have MyChart) OR . A paper copy in the mail If you have any lab test that is abnormal or we need to change your treatment, we will call you to review the results.   Testing/Procedures: None ordered   Follow-Up: At [facility], you and your health needs are our priority.  As part of our continuing mission to provide you with exceptional heart care, we have created designated Provider Care Teams.  These Care Teams include your primary Cardiologist (physician) and Advanced Practice Providers (APPs -  Physician Assistants and Nurse Practitioners) who all work together to provide you with the care you need, when you need it.  We recommend signing up for the patient portal called "MyChart".  Sign up information is provided on this After Visit Summary.  MyChart is used to connect with patients for Virtual Visits (Telemedicine).  Patients are able to view lab/test results, encounter notes, upcoming appointments, etc.  Non-urgent messages can be sent to your provider as well.   To learn more about what you can do with MyChart, go to [email or web address].    Your next appointment:   1 month(s)  The format for your next appointment:   In Person  Provider:   Rajan Revankar, MD   Other Instructions NA  

## 2020-11-11 DIAGNOSIS — R5383 Other fatigue: Secondary | ICD-10-CM | POA: Diagnosis not present

## 2020-11-11 DIAGNOSIS — J4540 Moderate persistent asthma, uncomplicated: Secondary | ICD-10-CM | POA: Diagnosis not present

## 2020-11-11 DIAGNOSIS — J479 Bronchiectasis, uncomplicated: Secondary | ICD-10-CM | POA: Diagnosis not present

## 2020-11-11 DIAGNOSIS — R0600 Dyspnea, unspecified: Secondary | ICD-10-CM | POA: Diagnosis not present

## 2020-11-11 DIAGNOSIS — J310 Chronic rhinitis: Secondary | ICD-10-CM | POA: Diagnosis not present

## 2020-11-12 DIAGNOSIS — R051 Acute cough: Secondary | ICD-10-CM | POA: Diagnosis not present

## 2020-11-17 DIAGNOSIS — R0600 Dyspnea, unspecified: Secondary | ICD-10-CM | POA: Diagnosis not present

## 2020-11-17 DIAGNOSIS — J479 Bronchiectasis, uncomplicated: Secondary | ICD-10-CM | POA: Diagnosis not present

## 2020-11-24 DIAGNOSIS — R059 Cough, unspecified: Secondary | ICD-10-CM | POA: Diagnosis not present

## 2020-11-24 DIAGNOSIS — J4521 Mild intermittent asthma with (acute) exacerbation: Secondary | ICD-10-CM | POA: Diagnosis not present

## 2020-11-24 DIAGNOSIS — A310 Pulmonary mycobacterial infection: Secondary | ICD-10-CM | POA: Diagnosis not present

## 2020-11-25 DIAGNOSIS — R5383 Other fatigue: Secondary | ICD-10-CM | POA: Diagnosis not present

## 2020-11-25 DIAGNOSIS — J4540 Moderate persistent asthma, uncomplicated: Secondary | ICD-10-CM | POA: Diagnosis not present

## 2020-11-25 DIAGNOSIS — F17210 Nicotine dependence, cigarettes, uncomplicated: Secondary | ICD-10-CM | POA: Diagnosis not present

## 2020-11-25 DIAGNOSIS — J479 Bronchiectasis, uncomplicated: Secondary | ICD-10-CM | POA: Diagnosis not present

## 2020-11-25 DIAGNOSIS — J310 Chronic rhinitis: Secondary | ICD-10-CM | POA: Diagnosis not present

## 2020-11-25 DIAGNOSIS — R0600 Dyspnea, unspecified: Secondary | ICD-10-CM | POA: Diagnosis not present

## 2020-11-26 DIAGNOSIS — J4540 Moderate persistent asthma, uncomplicated: Secondary | ICD-10-CM | POA: Diagnosis not present

## 2020-12-01 ENCOUNTER — Ambulatory Visit: Payer: PPO | Admitting: Cardiology

## 2020-12-01 ENCOUNTER — Encounter: Payer: Self-pay | Admitting: Cardiology

## 2020-12-01 ENCOUNTER — Other Ambulatory Visit: Payer: Self-pay

## 2020-12-01 VITALS — BP 142/74 | HR 60 | Ht 66.0 in | Wt 165.2 lb

## 2020-12-01 DIAGNOSIS — Z1329 Encounter for screening for other suspected endocrine disorder: Secondary | ICD-10-CM | POA: Diagnosis not present

## 2020-12-01 DIAGNOSIS — E782 Mixed hyperlipidemia: Secondary | ICD-10-CM | POA: Diagnosis not present

## 2020-12-01 DIAGNOSIS — I2510 Atherosclerotic heart disease of native coronary artery without angina pectoris: Secondary | ICD-10-CM | POA: Diagnosis not present

## 2020-12-01 DIAGNOSIS — F17210 Nicotine dependence, cigarettes, uncomplicated: Secondary | ICD-10-CM | POA: Diagnosis not present

## 2020-12-01 DIAGNOSIS — E7800 Pure hypercholesterolemia, unspecified: Secondary | ICD-10-CM

## 2020-12-01 NOTE — Progress Notes (Signed)
Cardiology Office Note:    Date:  12/01/2020   ID:  Laurie Barr, DOB 21-Oct-1950, MRN [id number]  PCP:  Laurie Right, MD  Cardiologist:  Laurie Lindau, MD   Referring MD: Laurie Right, MD    ASSESSMENT:    1. Coronary artery disease involving native coronary artery of native heart without angina pectoris   2. Hypercholesterolemia   3. Cigarette smoker   4. Mixed dyslipidemia    PLAN:    In order of problems listed above:  Coronary artery disease: Secondary prevention stressed with the patient.  Importance of compliance with diet medication stressed and she vocalized understanding.  She was advised to walk at least half an hour a day 5 days a week and she promises to do so. Essential hypertension: Blood pressure stable and diet was emphasized.  Lifestyle modification urged. Mixed dyslipidemia: Lipids well.  Weight today.  Diet emphasized.  Last lipids were reviewed from [place] sheet Cigarette smoker: I spent 5 minutes with the patient discussing solely about smoking. Smoking cessation was counseled. I suggested to the patient also different medications and pharmacological interventions. Patient is keen to try stopping on its own at this time. He will get back to me if he needs any further assistance in this matter. Patient will be seen in follow-up appointment in 6 months or earlier if the patient has any concerns    Medication Adjustments/Labs and Tests Ordered: Current medicines are reviewed at length with the patient today.  Concerns regarding medicines are outlined above.  No orders of the defined types were placed in this encounter.  No orders of the defined types were placed in this encounter.    No chief complaint on file.    History of Present Illness:    Laurie Barr is a 70 y.o. female.  Patient has past medical history of coronary artery disease, essential hypertension, dyslipidemia and is an active smoker.  She has COPD and she has been told to use  oxygen at night.  No chest pain orthopnea or PND.  At the time of my evaluation, the patient is alert awake oriented and in no distress.  Unfortunately she continues to smoke.  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   Anxiety    Asthma    Bronchiectasis ([place]) 10/01/2015   CAD (coronary artery disease) 12/19/2019   Chest tightness 08/12/2019   Cigarette smoker 10/20/2020   COPD (chronic obstructive pulmonary disease) ([place]) 10/01/2015   Dyspnea on exertion 08/12/2019   Endometriosis    Ex-smoker 08/12/2019   GERD (gastroesophageal reflux disease)    History of colonic polyps    Hypercholesterolemia    Hypertrophy of bone, left ankle and foot 05/05/2016   IBS (irritable bowel syndrome)    Mixed dyslipidemia 08/12/2019   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 Mild sigmoid diverticulosis   CYSTOSCOPY  11/17/2016   ELBOW SURGERY Left    removed bone due to fall/ Dr. Lorin Mercy   ESOPHAGOGASTRODUODENOSCOPY  11/19/2015   Mild gastritis.  Transient hiatal hernia. Otherwise, normal EGD   HAND SURGERY Barr    removed cyst   LAPAROTOMY     multiple due to endometriosis fibrous tumors and adhesions   STOMACH SURGERY     Tumor removed size of grapefruit    Current Medications: Current Meds  Medication Sig   albuterol (PROVENTIL HFA;VENTOLIN HFA) 108 (90 Base) MCG/ACT inhaler Inhale 2 puffs into the lungs every 6 (six) hours as needed for wheezing or shortness of breath.   aspirin 81 MG tablet Take 81 mg by mouth daily as needed for  pain.   baclofen (LIORESAL) 10 MG tablet Take 10 mg by mouth 2 (two) times daily as needed for muscle spasms.   citalopram (CELEXA) 10 MG tablet Take 10 mg by mouth daily.   fluticasone (FLONASE) 50 MCG/ACT nasal spray Place 2 sprays into both nostrils.   montelukast (SINGULAIR) 10 MG tablet Take 10 mg by mouth daily.   nitroGLYCERIN (NITROSTAT) 0.4 MG SL tablet Place 0.4 mg under the tongue every 5 (five) minutes as needed for chest pain.   pantoprazole (PROTONIX) 40 MG tablet Take 1 tablet (40 mg total) by mouth daily.   rosuvastatin (CRESTOR) 40 MG tablet Take 40 mg by mouth daily.   trimethoprim (TRIMPEX) 100 MG tablet Take 100 mg by mouth daily.     Allergies:   Patient has no known allergies.   Social History   Socioeconomic History   Marital status: Unknown    Spouse name: Not on file   Number of children: Not on file   Years of education: Not on file   Highest education level: Not on file  Occupational History   Not on file  Tobacco Use   Smoking status: Former    Types: Cigarettes    Quit date: 08/12/2015    Years since quitting: 5.3   Smokeless tobacco: Never  Vaping Use   Vaping Use: Never used  Substance and Sexual Activity   Alcohol use: Not Currently   Drug use: Never   Sexual activity: Not on file  Other Topics Concern   Not on file  Social History Narrative   Not on file   Social Determinants of Health   Financial Resource Strain: Not on file  Food Insecurity: Not on file  Transportation Needs: Not on file  Physical Activity: Not on file  Stress: Not on file  Social Connections: Not on file     Family History: The patient's family history includes Cancer in her father; Diabetes in her brother and sister; Hypertension in her brother, father, mother, and sister. There is no history of Colon cancer, Esophageal cancer, or Heart disease.  ROS:   Please see the history of present illness.    All other systems reviewed and are  negative.  EKGs/Labs/Other Studies Reviewed:    The following studies were reviewed today: I discussed my findings with the patient at length   Recent Labs: 04/21/2020: ALT 24; Hemoglobin 13.8; Platelets 218 07/14/2020: BUN 12; Creatinine, Ser 0.74; Magnesium 2.3; Potassium 4.1; Sodium 140; TSH 2.450  Recent Lipid Panel    Component Value Date/Time   CHOL 156 04/21/2020 1129   TRIG 144 04/21/2020 1129   HDL 60 04/21/2020 1129   CHOLHDL 2.6 04/21/2020 1129   LDLCALC 71 04/21/2020 1129    Physical Exam:    VS:  BP (!) 142/74   Pulse 60   Ht '5\' 6"'$  (1.676 m)   Wt 165 lb 3.2 oz (74.9 kg)  SpO2 94%   BMI 26.66 kg/m     Wt Readings from Last 3 Encounters:  12/01/20 165 lb 3.2 oz (74.9 kg)  10/20/20 160 lb 3.2 oz (72.7 kg)  07/14/20 161 lb (73 kg)     GEN: Patient is in no acute distress HEENT: Normal NECK: No JVD; No carotid bruits LYMPHATICS: No lymphadenopathy CARDIAC: Hear sounds regular, 2/6 systolic murmur at the apex. RESPIRATORY:  Clear to auscultation without rales, wheezing or rhonchi  ABDOMEN: Soft, non-tender, non-distended MUSCULOSKELETAL:  No edema; No deformity  SKIN: Warm and dry NEUROLOGIC:  Alert and oriented x 3 PSYCHIATRIC:  Normal affect   Signed, Laurie Lindau, MD  12/01/2020 9:23 AM    [facility]

## 2020-12-01 NOTE — Patient Instructions (Signed)

## 2020-12-02 LAB — CBC WITH DIFFERENTIAL/PLATELET
Basophils Absolute: 0.1 10*3/uL (ref 0.0–0.2)
Basos: 1 %
EOS (ABSOLUTE): 0.3 10*3/uL (ref 0.0–0.4)
Eos: 4 %
Hematocrit: 41.4 % (ref 34.0–46.6)
Hemoglobin: 14.1 g/dL (ref 11.1–15.9)
Immature Grans (Abs): 0 10*3/uL (ref 0.0–0.1)
Immature Granulocytes: 0 %
Lymphocytes Absolute: 1.1 10*3/uL (ref 0.7–3.1)
Lymphs: 16 %
MCH: 30.5 pg (ref 26.6–33.0)
MCHC: 34.1 g/dL (ref 31.5–35.7)
MCV: 89 fL (ref 79–97)
Monocytes Absolute: 0.6 10*3/uL (ref 0.1–0.9)
Monocytes: 8 %
Neutrophils Absolute: 5.2 10*3/uL (ref 1.4–7.0)
Neutrophils: 71 %
Platelets: 211 10*3/uL (ref 150–450)
RBC: 4.63 x10E6/uL (ref 3.77–5.28)
RDW: 13.1 % (ref 11.7–15.4)
WBC: 7.2 10*3/uL (ref 3.4–10.8)

## 2020-12-02 LAB — BASIC METABOLIC PANEL
BUN/Creatinine Ratio: 14 (ref 12–28)
BUN: 11 mg/dL (ref 8–27)
CO2: 23 mmol/L (ref 20–29)
Calcium: 8.8 mg/dL (ref 8.7–10.3)
Chloride: 102 mmol/L (ref 96–106)
Creatinine, Ser: 0.78 mg/dL (ref 0.57–1.00)
Glucose: 95 mg/dL (ref 65–99)
Potassium: 4.4 mmol/L (ref 3.5–5.2)
Sodium: 139 mmol/L (ref 134–144)
eGFR: 82 mL/min/{1.73_m2} (ref >59)

## 2020-12-02 LAB — HEPATIC FUNCTION PANEL
ALT: 18 IU/L (ref 0–32)
AST: 26 IU/L (ref 0–40)
Albumin: 4.3 g/dL (ref 3.8–4.8)
Alkaline Phosphatase: 67 IU/L (ref 44–121)
Bilirubin Total: 0.9 mg/dL (ref 0.0–1.2)
Bilirubin, Direct: 0.24 mg/dL (ref 0.00–0.40)
Total Protein: 6 g/dL (ref 6.0–8.5)

## 2020-12-02 LAB — LIPID PANEL
Chol/HDL Ratio: 2.1 ratio (ref 0.0–4.4)
Cholesterol, Total: 147 mg/dL (ref 100–199)
HDL: 71 mg/dL (ref >39)
LDL Chol Calc (NIH): 55 mg/dL (ref 0–99)
Triglycerides: 123 mg/dL (ref 0–149)
VLDL Cholesterol Cal: 21 mg/dL (ref 5–40)

## 2020-12-02 LAB — TSH: TSH: 2.08 u[IU]/mL (ref 0.450–4.500)

## 2020-12-16 DIAGNOSIS — F17210 Nicotine dependence, cigarettes, uncomplicated: Secondary | ICD-10-CM | POA: Diagnosis not present

## 2020-12-16 DIAGNOSIS — J310 Chronic rhinitis: Secondary | ICD-10-CM | POA: Diagnosis not present

## 2020-12-16 DIAGNOSIS — J479 Bronchiectasis, uncomplicated: Secondary | ICD-10-CM | POA: Diagnosis not present

## 2020-12-16 DIAGNOSIS — R0600 Dyspnea, unspecified: Secondary | ICD-10-CM | POA: Diagnosis not present

## 2020-12-16 DIAGNOSIS — R5383 Other fatigue: Secondary | ICD-10-CM | POA: Diagnosis not present

## 2020-12-16 DIAGNOSIS — J4540 Moderate persistent asthma, uncomplicated: Secondary | ICD-10-CM | POA: Diagnosis not present

## 2020-12-25 DIAGNOSIS — M810 Age-related osteoporosis without current pathological fracture: Secondary | ICD-10-CM | POA: Diagnosis not present

## 2020-12-25 DIAGNOSIS — E7800 Pure hypercholesterolemia, unspecified: Secondary | ICD-10-CM | POA: Diagnosis not present

## 2020-12-25 DIAGNOSIS — F411 Generalized anxiety disorder: Secondary | ICD-10-CM | POA: Diagnosis not present

## 2020-12-25 DIAGNOSIS — K219 Gastro-esophageal reflux disease without esophagitis: Secondary | ICD-10-CM | POA: Diagnosis not present

## 2020-12-25 DIAGNOSIS — I2584 Coronary atherosclerosis due to calcified coronary lesion: Secondary | ICD-10-CM | POA: Diagnosis not present

## 2020-12-26 DIAGNOSIS — J4540 Moderate persistent asthma, uncomplicated: Secondary | ICD-10-CM | POA: Diagnosis not present

## 2021-01-06 DIAGNOSIS — R350 Frequency of micturition: Secondary | ICD-10-CM | POA: Diagnosis not present

## 2021-01-06 DIAGNOSIS — N3000 Acute cystitis without hematuria: Secondary | ICD-10-CM | POA: Diagnosis not present

## 2021-01-06 DIAGNOSIS — J411 Mucopurulent chronic bronchitis: Secondary | ICD-10-CM | POA: Diagnosis not present

## 2021-02-03 DIAGNOSIS — I700 Atherosclerosis of aorta: Secondary | ICD-10-CM | POA: Diagnosis not present

## 2021-02-03 DIAGNOSIS — F411 Generalized anxiety disorder: Secondary | ICD-10-CM | POA: Diagnosis not present

## 2021-02-03 DIAGNOSIS — J411 Mucopurulent chronic bronchitis: Secondary | ICD-10-CM | POA: Diagnosis not present

## 2021-02-03 DIAGNOSIS — M810 Age-related osteoporosis without current pathological fracture: Secondary | ICD-10-CM | POA: Diagnosis not present

## 2021-02-03 DIAGNOSIS — Z23 Encounter for immunization: Secondary | ICD-10-CM | POA: Diagnosis not present

## 2021-02-03 DIAGNOSIS — Z6832 Body mass index (BMI) 32.0-32.9, adult: Secondary | ICD-10-CM | POA: Diagnosis not present

## 2021-02-03 DIAGNOSIS — E669 Obesity, unspecified: Secondary | ICD-10-CM | POA: Diagnosis not present

## 2021-02-03 DIAGNOSIS — K219 Gastro-esophageal reflux disease without esophagitis: Secondary | ICD-10-CM | POA: Diagnosis not present

## 2021-02-03 DIAGNOSIS — Z0000 Encounter for general adult medical examination without abnormal findings: Secondary | ICD-10-CM | POA: Diagnosis not present

## 2021-02-03 DIAGNOSIS — I2584 Coronary atherosclerosis due to calcified coronary lesion: Secondary | ICD-10-CM | POA: Diagnosis not present

## 2021-02-03 DIAGNOSIS — Z2821 Immunization not carried out because of patient refusal: Secondary | ICD-10-CM | POA: Diagnosis not present

## 2021-02-05 DIAGNOSIS — H2511 Age-related nuclear cataract, right eye: Secondary | ICD-10-CM | POA: Diagnosis not present

## 2021-02-05 DIAGNOSIS — H25811 Combined forms of age-related cataract, right eye: Secondary | ICD-10-CM | POA: Diagnosis not present

## 2021-02-05 DIAGNOSIS — Z01818 Encounter for other preprocedural examination: Secondary | ICD-10-CM | POA: Diagnosis not present

## 2021-02-08 DIAGNOSIS — R339 Retention of urine, unspecified: Secondary | ICD-10-CM | POA: Diagnosis not present

## 2021-02-08 DIAGNOSIS — N390 Urinary tract infection, site not specified: Secondary | ICD-10-CM | POA: Diagnosis not present

## 2021-02-08 DIAGNOSIS — N952 Postmenopausal atrophic vaginitis: Secondary | ICD-10-CM | POA: Diagnosis not present

## 2021-02-16 DIAGNOSIS — H2511 Age-related nuclear cataract, right eye: Secondary | ICD-10-CM | POA: Diagnosis not present

## 2021-02-16 DIAGNOSIS — F17210 Nicotine dependence, cigarettes, uncomplicated: Secondary | ICD-10-CM | POA: Diagnosis not present

## 2021-02-16 DIAGNOSIS — H259 Unspecified age-related cataract: Secondary | ICD-10-CM | POA: Diagnosis not present

## 2021-02-16 DIAGNOSIS — H25811 Combined forms of age-related cataract, right eye: Secondary | ICD-10-CM | POA: Diagnosis not present

## 2021-02-16 DIAGNOSIS — J45909 Unspecified asthma, uncomplicated: Secondary | ICD-10-CM | POA: Diagnosis not present

## 2021-02-16 DIAGNOSIS — H26492 Other secondary cataract, left eye: Secondary | ICD-10-CM | POA: Diagnosis not present

## 2021-02-16 DIAGNOSIS — Z961 Presence of intraocular lens: Secondary | ICD-10-CM | POA: Diagnosis not present

## 2021-03-08 DIAGNOSIS — R0600 Dyspnea, unspecified: Secondary | ICD-10-CM | POA: Diagnosis not present

## 2021-03-08 DIAGNOSIS — J479 Bronchiectasis, uncomplicated: Secondary | ICD-10-CM | POA: Diagnosis not present

## 2021-03-08 DIAGNOSIS — I700 Atherosclerosis of aorta: Secondary | ICD-10-CM | POA: Diagnosis not present

## 2021-03-09 DIAGNOSIS — Z1231 Encounter for screening mammogram for malignant neoplasm of breast: Secondary | ICD-10-CM | POA: Diagnosis not present

## 2021-03-12 DIAGNOSIS — J479 Bronchiectasis, uncomplicated: Secondary | ICD-10-CM | POA: Diagnosis not present

## 2021-03-12 DIAGNOSIS — F17210 Nicotine dependence, cigarettes, uncomplicated: Secondary | ICD-10-CM | POA: Diagnosis not present

## 2021-03-12 DIAGNOSIS — R5383 Other fatigue: Secondary | ICD-10-CM | POA: Diagnosis not present

## 2021-03-12 DIAGNOSIS — J4540 Moderate persistent asthma, uncomplicated: Secondary | ICD-10-CM | POA: Diagnosis not present

## 2021-03-12 DIAGNOSIS — J310 Chronic rhinitis: Secondary | ICD-10-CM | POA: Diagnosis not present

## 2021-03-12 DIAGNOSIS — R0600 Dyspnea, unspecified: Secondary | ICD-10-CM | POA: Diagnosis not present

## 2021-03-26 DIAGNOSIS — K219 Gastro-esophageal reflux disease without esophagitis: Secondary | ICD-10-CM | POA: Diagnosis not present

## 2021-03-26 DIAGNOSIS — F411 Generalized anxiety disorder: Secondary | ICD-10-CM | POA: Diagnosis not present

## 2021-03-26 DIAGNOSIS — E7800 Pure hypercholesterolemia, unspecified: Secondary | ICD-10-CM | POA: Diagnosis not present

## 2021-04-12 DIAGNOSIS — N3000 Acute cystitis without hematuria: Secondary | ICD-10-CM | POA: Diagnosis not present

## 2021-04-12 DIAGNOSIS — R300 Dysuria: Secondary | ICD-10-CM | POA: Diagnosis not present

## 2021-05-03 DIAGNOSIS — R233 Spontaneous ecchymoses: Secondary | ICD-10-CM | POA: Diagnosis not present

## 2021-05-03 DIAGNOSIS — L209 Atopic dermatitis, unspecified: Secondary | ICD-10-CM | POA: Diagnosis not present

## 2021-05-24 DIAGNOSIS — L300 Nummular dermatitis: Secondary | ICD-10-CM | POA: Diagnosis not present

## 2021-05-24 DIAGNOSIS — L299 Pruritus, unspecified: Secondary | ICD-10-CM | POA: Diagnosis not present

## 2021-05-26 DIAGNOSIS — N390 Urinary tract infection, site not specified: Secondary | ICD-10-CM | POA: Diagnosis not present

## 2021-06-03 ENCOUNTER — Other Ambulatory Visit: Payer: Self-pay

## 2021-06-03 ENCOUNTER — Ambulatory Visit: Payer: PPO | Admitting: Cardiology

## 2021-06-03 ENCOUNTER — Encounter: Payer: Self-pay | Admitting: Cardiology

## 2021-06-03 VITALS — BP 130/70 | HR 60 | Ht 66.0 in | Wt 167.6 lb

## 2021-06-03 DIAGNOSIS — Z131 Encounter for screening for diabetes mellitus: Secondary | ICD-10-CM | POA: Diagnosis not present

## 2021-06-03 DIAGNOSIS — F17210 Nicotine dependence, cigarettes, uncomplicated: Secondary | ICD-10-CM

## 2021-06-03 DIAGNOSIS — I208 Other forms of angina pectoris: Secondary | ICD-10-CM | POA: Diagnosis not present

## 2021-06-03 DIAGNOSIS — Z1329 Encounter for screening for other suspected endocrine disorder: Secondary | ICD-10-CM

## 2021-06-03 DIAGNOSIS — Z87891 Personal history of nicotine dependence: Secondary | ICD-10-CM

## 2021-06-03 DIAGNOSIS — I2510 Atherosclerotic heart disease of native coronary artery without angina pectoris: Secondary | ICD-10-CM | POA: Diagnosis not present

## 2021-06-03 DIAGNOSIS — Z1321 Encounter for screening for nutritional disorder: Secondary | ICD-10-CM

## 2021-06-03 DIAGNOSIS — E782 Mixed hyperlipidemia: Secondary | ICD-10-CM

## 2021-06-03 NOTE — Progress Notes (Signed)
?Cardiology Office Note:   ? ?Date:  06/03/2021  ? ?ID:  Laurie Barr, DOB 1951-01-09, MRN [id number] ? ?PCP:  Greig Right, MD  ?Cardiologist:  Jenean Lindau, MD  ? ?Referring MD: Greig Right, MD  ? ? ?ASSESSMENT:   ? ?1. Coronary artery disease involving native coronary artery of native heart without angina pectoris   ?2. Stable angina pectoris ([place])   ?3. Ex-smoker   ?4. Mixed dyslipidemia   ?5. Thyroid disorder screen   ?6. Encounter for vitamin deficiency screening   ?7. Screening for diabetes mellitus   ?8. Cigarette smoker   ? ?PLAN:   ? ?In order of problems listed above: ? ?Coronary artery disease: Secondary prevention stressed with the patient.  Importance of compliance with diet medication stressed.  She was advised to walk at least half an hour a day 5 days a week and she promises to do so. ?Essential hypertension: Blood pressure is stable and diet was emphasized.  Lifestyle modification urged. ?Mixed dyslipidemia: Lipids were reviewed and discussed with the patient at length.  She is fasting and will have complete blood work today.  She requests hemoglobin A1c and vitamin D and we will check this for her. ?Cigarette smoker: I spent 5 minutes with the patient discussing solely about smoking. Smoking cessation was counseled. I suggested to the patient also different medications and pharmacological interventions. Patient is keen to try stopping on its own at this time. He will get back to me if he needs any further assistance in this matter. ?Patient will be seen in follow-up appointment in 9 months or earlier if the patient has any concerns ? ? ? ?Medication Adjustments/Labs and Tests Ordered: ?Current medicines are reviewed at length with the patient today.  Concerns regarding medicines are outlined above.  ?Orders Placed This Encounter  ?Procedures  ? Basic metabolic panel  ? CBC with Differential/Platelet  ? Hepatic function panel  ? Lipid panel  ? TSH  ? VITAMIN D 25 Hydroxy (Vit-D  Deficiency, Fractures)  ? Hemoglobin A1c  ? ?No orders of the defined types were placed in this encounter. ? ? ? ?No chief complaint on file. ?  ? ?History of Present Illness:   ? ?Laurie Barr is a 71 y.o. female.  Patient has past medical history of coronary artery disease, essential hypertension, mixed dyslipidemia and unfortunately continues to smoke.  She denies any chest pain orthopnea or PND.  She does not exercise on a regular basis.  Unfortunately she has not had any intentions to quit smoking. ? ?Past Medical History:  ?Diagnosis Date  ? Achilles tendinitis of left lower extremity 08/22/2019  ? 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 Anxiety   ? Asthma   ? Bronchiectasis ([place]) 10/01/2015  ? CAD (coronary artery disease) 12/19/2019  ? Chest tightness 08/12/2019  ? Cigarette smoker 10/20/2020  ? COPD (chronic obstructive pulmonary disease) ([place]) 10/01/2015  ? Dyspnea on exertion 08/12/2019  ? Endometriosis   ? Ex-smoker 08/12/2019  ? GERD (gastroesophageal reflux disease)   ? History of colonic polyps   ? Hypercholesterolemia   ? Hypertrophy of bone, left ankle and foot 05/05/2016  ? IBS (irritable bowel syndrome)   ? Mixed dyslipidemia 08/12/2019  ? 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 Nodule of right lung 10/01/2015  ? Plantar callus 05/05/2016  ? Plantar fasciitis 08/22/2019  ? Pneumonia   ? 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 Tailor's bunion of left foot 05/05/2016  ? Tightness of heel cord,  right 08/22/2019  ? ? ?Past Surgical History:  ?Procedure Laterality Date  ? ABDOMINAL HYSTERECTOMY    ? COLONOSCOPY  11/04/2013  ? Colonic polyp status post polypectomy. Mild sigmoid diverticulosis  ? CYSTOSCOPY  11/17/2016  ? ELBOW SURGERY Left   ? removed bone due to fall/ Dr. Lorin Mercy  ? ESOPHAGOGASTRODUODENOSCOPY  11/19/2015  ? Mild gastritis. Transient hiatal hernia. Otherwise, normal EGD  ? HAND SURGERY Right   ? removed cyst  ? LAPAROTOMY    ? multiple due to endometriosis fibrous tumors and adhesions  ?  STOMACH SURGERY    ? Tumor removed size of grapefruit  ? ? ?Current Medications: ?Current Meds  ?Medication Sig  ? albuterol (PROVENTIL HFA;VENTOLIN HFA) 108 (90 Base) MCG/ACT inhaler Inhale 2 puffs into the lungs every 6 (six) hours as needed for wheezing or shortness of breath.  ? aspirin 81 MG tablet Take 81 mg by mouth daily as needed for pain.  ? baclofen (LIORESAL) 10 MG tablet Take 10 mg by mouth 2 (two) times daily as needed for muscle spasms.  ? citalopram (CELEXA) 10 MG tablet Take 10 mg by mouth daily.  ? fluticasone (FLONASE) 50 MCG/ACT nasal spray Place 2 sprays into both nostrils.  ? montelukast (SINGULAIR) 10 MG tablet Take 10 mg by mouth daily.  ? nitroGLYCERIN (NITROSTAT) 0.4 MG SL tablet Place 0.4 mg under the tongue every 5 (five) minutes as needed for chest pain.  ? nystatin cream (MYCOSTATIN) Apply 1 application. topically 4 (four) times daily.  ? pantoprazole (PROTONIX) 40 MG tablet Take 1 tablet (40 mg total) by mouth daily.  ? rosuvastatin (CRESTOR) 40 MG tablet Take 40 mg by mouth daily.  ? trimethoprim (TRIMPEX) 100 MG tablet Take 100 mg by mouth daily.  ?  ? ?Allergies:   Patient has no known allergies.  ? ?Social History  ? ?Socioeconomic History  ? Marital status: Unknown  ?  Spouse name: Not on file  ? Number of children: Not on file  ? Years of education: Not on file  ? Highest education level: Not on file  ?Occupational History  ? Not on file  ?Tobacco Use  ? Smoking status: Former  ?  Types: Cigarettes  ?  Quit date: 08/12/2015  ?  Years since quitting: 5.8  ? Smokeless tobacco: Never  ?Vaping Use  ? Vaping Use: Never used  ?Substance and Sexual Activity  ? Alcohol use: Not Currently  ? Drug use: Never  ? Sexual activity: Not on file  ?Other Topics Concern  ? Not on file  ?Social History Narrative  ? Not on file  ? ?Social Determinants of Health  ? ?Financial Resource Strain: Not on file  ?Food Insecurity: Not on file  ?Transportation Needs: Not on file  ?Physical Activity: Not on  file  ?Stress: Not on file  ?Social Connections: Not on file  ?  ? ?Family History: ?The patient's family history includes Cancer in her father; Diabetes in her brother and sister; Hypertension in her brother, father, mother, and sister. There is no history of Colon cancer, Esophageal cancer, or Heart disease. ? ?ROS:   ?Please see the history of present illness.    ?All other systems reviewed and are negative. ? ?EKGs/Labs/Other Studies Reviewed:   ? ?The following studies were reviewed today: ?I discussed my findings with the patient at length ? ? ?Recent Labs: ?07/14/2020: Magnesium 2.3 ?12/01/2020: ALT 18; BUN 11; Creatinine, Ser 0.78; Hemoglobin 14.1; Platelets 211; Potassium 4.4; Sodium 139; TSH 2.080  ?  Recent Lipid Panel ?   ?Component Value Date/Time  ? CHOL 147 12/01/2020 0931  ? TRIG 123 12/01/2020 0931  ? HDL 71 12/01/2020 0931  ? CHOLHDL 2.1 12/01/2020 0931  ? [place] 55 12/01/2020 0931  ? ? ?Physical Exam:   ? ?VS:  BP 130/70   Pulse 60   Ht '5\' 6"'$  (1.676 m)   Wt 167 lb 9.6 oz (76 kg)   SpO2 96%   BMI 27.05 kg/m?    ? ?Wt Readings from Last 3 Encounters:  ?06/03/21 167 lb 9.6 oz (76 kg)  ?12/01/20 165 lb 3.2 oz (74.9 kg)  ?10/20/20 160 lb 3.2 oz (72.7 kg)  ?  ? ?GEN: Patient is in no acute distress ?HEENT: Normal ?NECK: No JVD; No carotid bruits ?LYMPHATICS: No lymphadenopathy ?CARDIAC: Hear sounds regular, 2/6 systolic murmur at the apex. ?RESPIRATORY:  Clear to auscultation without rales, wheezing or rhonchi  ?ABDOMEN: Soft, non-tender, non-distended ?MUSCULOSKELETAL:  No edema; No deformity  ?SKIN: Warm and dry ?NEUROLOGIC:  Alert and oriented x 3 ?PSYCHIATRIC:  Normal affect  ? ?Signed, ?Jenean Lindau, MD  ?06/03/2021 10:55 AM    ?[place]  ?

## 2021-06-03 NOTE — Patient Instructions (Signed)
Medication Instructions:  ?Your physician recommends that you continue on your current medications as directed. Please refer to the Current Medication list given to you today. ? ?*If you need a refill on your cardiac medications before your next appointment, please call your pharmacy* ? ? ?Lab Work: ?Your physician recommends that you have labs done in the office today. Your test included  basic metabolic panel, complete blood count, TSH, vitamin D, hgb A1C, liver function and lipids. ? ?If you have labs (blood work) drawn today and your tests are completely normal, you will receive your results only by: ?MyChart Message (if you have MyChart) OR ?A paper copy in the mail ?If you have any lab test that is abnormal or we need to change your treatment, we will call you to review the results. ? ? ?Testing/Procedures: ?None ordered ? ? ?Follow-Up: ?At [facility], you and your health needs are our priority.  As part of our continuing mission to provide you with exceptional heart care, we have created designated Provider Care Teams.  These Care Teams include your primary Cardiologist (physician) and Advanced Practice Providers (APPs -  Physician Assistants and Nurse Practitioners) who all work together to provide you with the care you need, when you need it. ? ?We recommend signing up for the patient portal called "MyChart".  Sign up information is provided on this After Visit Summary.  MyChart is used to connect with patients for Virtual Visits (Telemedicine).  Patients are able to view lab/test results, encounter notes, upcoming appointments, etc.  Non-urgent messages can be sent to your provider as well.   ?To learn more about what you can do with MyChart, go to [email or web address].   ? ?Your next appointment:   ?9 month(s) ? ?The format for your next appointment:   ?In Person ? ?Provider:   ?Jyl Heinz, MD ? ? ?Other Instructions ?NA   ?

## 2021-06-04 ENCOUNTER — Telehealth: Payer: Self-pay | Admitting: *Deleted

## 2021-06-04 DIAGNOSIS — F17210 Nicotine dependence, cigarettes, uncomplicated: Secondary | ICD-10-CM | POA: Diagnosis not present

## 2021-06-04 DIAGNOSIS — R0600 Dyspnea, unspecified: Secondary | ICD-10-CM | POA: Diagnosis not present

## 2021-06-04 DIAGNOSIS — R5383 Other fatigue: Secondary | ICD-10-CM | POA: Diagnosis not present

## 2021-06-04 DIAGNOSIS — J4540 Moderate persistent asthma, uncomplicated: Secondary | ICD-10-CM | POA: Diagnosis not present

## 2021-06-04 DIAGNOSIS — J310 Chronic rhinitis: Secondary | ICD-10-CM | POA: Diagnosis not present

## 2021-06-04 DIAGNOSIS — J479 Bronchiectasis, uncomplicated: Secondary | ICD-10-CM | POA: Diagnosis not present

## 2021-06-04 LAB — BASIC METABOLIC PANEL
BUN/Creatinine Ratio: 11 — ABNORMAL LOW (ref 12–28)
BUN: 9 mg/dL (ref 8–27)
CO2: 23 mmol/L (ref 20–29)
Calcium: 9.2 mg/dL (ref 8.7–10.3)
Chloride: 100 mmol/L (ref 96–106)
Creatinine, Ser: 0.83 mg/dL (ref 0.57–1.00)
Glucose: 99 mg/dL (ref 70–99)
Potassium: 4.5 mmol/L (ref 3.5–5.2)
Sodium: 139 mmol/L (ref 134–144)
eGFR: 76 mL/min/{1.73_m2} (ref >59)

## 2021-06-04 LAB — CBC WITH DIFFERENTIAL/PLATELET
Basophils Absolute: 0.1 10*3/uL (ref 0.0–0.2)
Basos: 1 %
EOS (ABSOLUTE): 0.3 10*3/uL (ref 0.0–0.4)
Eos: 5 %
Hematocrit: 43.5 % (ref 34.0–46.6)
Hemoglobin: 15 g/dL (ref 11.1–15.9)
Immature Grans (Abs): 0 10*3/uL (ref 0.0–0.1)
Immature Granulocytes: 0 %
Lymphocytes Absolute: 1.2 10*3/uL (ref 0.7–3.1)
Lymphs: 16 %
MCH: 31.3 pg (ref 26.6–33.0)
MCHC: 34.5 g/dL (ref 31.5–35.7)
MCV: 91 fL (ref 79–97)
Monocytes Absolute: 0.7 10*3/uL (ref 0.1–0.9)
Monocytes: 9 %
Neutrophils Absolute: 5.3 10*3/uL (ref 1.4–7.0)
Neutrophils: 69 %
Platelets: 225 10*3/uL (ref 150–450)
RBC: 4.79 x10E6/uL (ref 3.77–5.28)
RDW: 13 % (ref 11.7–15.4)
WBC: 7.6 10*3/uL (ref 3.4–10.8)

## 2021-06-04 LAB — HEMOGLOBIN A1C
Est. average glucose Bld gHb Est-mCnc: 128 mg/dL
Hgb A1c MFr Bld: 6.1 % — ABNORMAL HIGH (ref 4.8–5.6)

## 2021-06-04 LAB — LIPID PANEL
Chol/HDL Ratio: 2.4 ratio (ref 0.0–4.4)
Cholesterol, Total: 160 mg/dL (ref 100–199)
HDL: 68 mg/dL (ref >39)
LDL Chol Calc (NIH): 62 mg/dL (ref 0–99)
Triglycerides: 186 mg/dL — ABNORMAL HIGH (ref 0–149)
VLDL Cholesterol Cal: 30 mg/dL (ref 5–40)

## 2021-06-04 LAB — TSH: TSH: 2.33 u[IU]/mL (ref 0.450–4.500)

## 2021-06-04 LAB — HEPATIC FUNCTION PANEL
ALT: 23 IU/L (ref 0–32)
AST: 31 IU/L (ref 0–40)
Albumin: 4.5 g/dL (ref 3.8–4.8)
Alkaline Phosphatase: 66 IU/L (ref 44–121)
Bilirubin Total: 1 mg/dL (ref 0.0–1.2)
Bilirubin, Direct: 0.25 mg/dL (ref 0.00–0.40)
Total Protein: 6.7 g/dL (ref 6.0–8.5)

## 2021-06-04 LAB — VITAMIN D 25 HYDROXY (VIT D DEFICIENCY, FRACTURES): Vit D, 25-Hydroxy: 32.9 ng/mL (ref 30.0–100.0)

## 2021-06-04 NOTE — Telephone Encounter (Signed)
Results notified to the patient and sent to PCP ?

## 2021-06-04 NOTE — Telephone Encounter (Signed)
Left message on vm for pt to call us back for results ?

## 2021-06-04 NOTE — Telephone Encounter (Signed)
-----   Message from Jenean Lindau, MD sent at 06/04/2021  8:47 AM EST ----- ?The results of the study is unremarkable. Please inform patient. I will discuss in detail at next appointment. Cc  primary care/referring physician ?Jenean Lindau, MD 06/04/2021 8:47 AM  ?

## 2021-06-04 NOTE — Telephone Encounter (Signed)
Patient was returning call for results 

## 2021-06-13 DIAGNOSIS — J209 Acute bronchitis, unspecified: Secondary | ICD-10-CM | POA: Diagnosis not present

## 2021-06-15 ENCOUNTER — Telehealth: Payer: Self-pay | Admitting: Cardiology

## 2021-06-15 NOTE — Telephone Encounter (Signed)
Pt arrived to the office with a copy of her EKG and Dr. Geraldo Pitter reviewed. EKG is the same as in the past. Pt advised and had no additional questions. ?

## 2021-06-15 NOTE — Telephone Encounter (Signed)
?  Pt said she went to urgent care and they did an EKG to her and the doctor said she might having some blockage in her heart she has abnormal EKG, she would like to f/u with Dr. Geraldo Pitter ?

## 2021-06-25 DIAGNOSIS — J411 Mucopurulent chronic bronchitis: Secondary | ICD-10-CM | POA: Diagnosis not present

## 2021-06-25 DIAGNOSIS — M810 Age-related osteoporosis without current pathological fracture: Secondary | ICD-10-CM | POA: Diagnosis not present

## 2021-06-25 DIAGNOSIS — E7800 Pure hypercholesterolemia, unspecified: Secondary | ICD-10-CM | POA: Diagnosis not present

## 2021-07-06 DIAGNOSIS — R0600 Dyspnea, unspecified: Secondary | ICD-10-CM | POA: Diagnosis not present

## 2021-07-06 DIAGNOSIS — J4540 Moderate persistent asthma, uncomplicated: Secondary | ICD-10-CM | POA: Diagnosis not present

## 2021-07-06 DIAGNOSIS — F17210 Nicotine dependence, cigarettes, uncomplicated: Secondary | ICD-10-CM | POA: Diagnosis not present

## 2021-07-06 DIAGNOSIS — R5383 Other fatigue: Secondary | ICD-10-CM | POA: Diagnosis not present

## 2021-07-06 DIAGNOSIS — J310 Chronic rhinitis: Secondary | ICD-10-CM | POA: Diagnosis not present

## 2021-07-20 DIAGNOSIS — J4540 Moderate persistent asthma, uncomplicated: Secondary | ICD-10-CM | POA: Diagnosis not present

## 2021-07-20 DIAGNOSIS — J310 Chronic rhinitis: Secondary | ICD-10-CM | POA: Diagnosis not present

## 2021-07-20 DIAGNOSIS — F17210 Nicotine dependence, cigarettes, uncomplicated: Secondary | ICD-10-CM | POA: Diagnosis not present

## 2021-07-20 DIAGNOSIS — R0600 Dyspnea, unspecified: Secondary | ICD-10-CM | POA: Diagnosis not present

## 2021-07-20 DIAGNOSIS — J479 Bronchiectasis, uncomplicated: Secondary | ICD-10-CM | POA: Diagnosis not present

## 2021-07-20 DIAGNOSIS — R5383 Other fatigue: Secondary | ICD-10-CM | POA: Diagnosis not present

## 2021-07-27 DIAGNOSIS — J310 Chronic rhinitis: Secondary | ICD-10-CM | POA: Diagnosis not present

## 2021-07-27 DIAGNOSIS — R0600 Dyspnea, unspecified: Secondary | ICD-10-CM | POA: Diagnosis not present

## 2021-07-27 DIAGNOSIS — J479 Bronchiectasis, uncomplicated: Secondary | ICD-10-CM | POA: Diagnosis not present

## 2021-07-27 DIAGNOSIS — R5383 Other fatigue: Secondary | ICD-10-CM | POA: Diagnosis not present

## 2021-07-27 DIAGNOSIS — J4540 Moderate persistent asthma, uncomplicated: Secondary | ICD-10-CM | POA: Diagnosis not present

## 2021-07-27 DIAGNOSIS — D803 Selective deficiency of immunoglobulin G [IgG] subclasses: Secondary | ICD-10-CM | POA: Diagnosis not present

## 2021-08-04 DIAGNOSIS — D803 Selective deficiency of immunoglobulin G [IgG] subclasses: Secondary | ICD-10-CM | POA: Diagnosis not present

## 2021-08-17 DIAGNOSIS — D803 Selective deficiency of immunoglobulin G [IgG] subclasses: Secondary | ICD-10-CM | POA: Diagnosis not present

## 2021-08-17 DIAGNOSIS — J310 Chronic rhinitis: Secondary | ICD-10-CM | POA: Diagnosis not present

## 2021-08-17 DIAGNOSIS — R0600 Dyspnea, unspecified: Secondary | ICD-10-CM | POA: Diagnosis not present

## 2021-08-17 DIAGNOSIS — J4540 Moderate persistent asthma, uncomplicated: Secondary | ICD-10-CM | POA: Diagnosis not present

## 2021-08-17 DIAGNOSIS — J479 Bronchiectasis, uncomplicated: Secondary | ICD-10-CM | POA: Diagnosis not present

## 2021-08-17 DIAGNOSIS — R5383 Other fatigue: Secondary | ICD-10-CM | POA: Diagnosis not present

## 2021-08-26 DIAGNOSIS — Z6832 Body mass index (BMI) 32.0-32.9, adult: Secondary | ICD-10-CM | POA: Diagnosis not present

## 2021-08-26 DIAGNOSIS — N3000 Acute cystitis without hematuria: Secondary | ICD-10-CM | POA: Diagnosis not present

## 2021-09-06 ENCOUNTER — Telehealth: Payer: Self-pay | Admitting: Cardiology

## 2021-09-06 ENCOUNTER — Telehealth: Payer: Self-pay

## 2021-09-06 NOTE — Telephone Encounter (Signed)
Spoke with pt about her message. She stated that she has an appt for July 3. She did not have any further questions.

## 2021-09-06 NOTE — Telephone Encounter (Signed)
Patient called requesting an appt for palpitations.  Was unable to get dot phrase answers before call disconnected.  Patient was scheduled for first available on 7/3 regarding this.  Attempted to call pt back to get dot phrase information but was unable to get an answer.

## 2021-09-14 DIAGNOSIS — R0600 Dyspnea, unspecified: Secondary | ICD-10-CM | POA: Diagnosis not present

## 2021-09-14 DIAGNOSIS — J4540 Moderate persistent asthma, uncomplicated: Secondary | ICD-10-CM | POA: Diagnosis not present

## 2021-09-14 DIAGNOSIS — R5383 Other fatigue: Secondary | ICD-10-CM | POA: Diagnosis not present

## 2021-09-14 DIAGNOSIS — F17210 Nicotine dependence, cigarettes, uncomplicated: Secondary | ICD-10-CM | POA: Diagnosis not present

## 2021-09-14 DIAGNOSIS — J310 Chronic rhinitis: Secondary | ICD-10-CM | POA: Diagnosis not present

## 2021-09-14 DIAGNOSIS — D830 Common variable immunodeficiency with predominant abnormalities of B-cell numbers and function: Secondary | ICD-10-CM | POA: Diagnosis not present

## 2021-09-14 DIAGNOSIS — J4520 Mild intermittent asthma, uncomplicated: Secondary | ICD-10-CM | POA: Diagnosis not present

## 2021-09-14 DIAGNOSIS — J479 Bronchiectasis, uncomplicated: Secondary | ICD-10-CM | POA: Diagnosis not present

## 2021-09-23 ENCOUNTER — Other Ambulatory Visit: Payer: Self-pay

## 2021-09-23 DIAGNOSIS — R0600 Dyspnea, unspecified: Secondary | ICD-10-CM | POA: Diagnosis not present

## 2021-09-23 DIAGNOSIS — J479 Bronchiectasis, uncomplicated: Secondary | ICD-10-CM | POA: Diagnosis not present

## 2021-09-23 DIAGNOSIS — R5383 Other fatigue: Secondary | ICD-10-CM | POA: Diagnosis not present

## 2021-09-23 DIAGNOSIS — D803 Selective deficiency of immunoglobulin G [IgG] subclasses: Secondary | ICD-10-CM | POA: Diagnosis not present

## 2021-09-23 DIAGNOSIS — J310 Chronic rhinitis: Secondary | ICD-10-CM | POA: Diagnosis not present

## 2021-09-23 DIAGNOSIS — J4540 Moderate persistent asthma, uncomplicated: Secondary | ICD-10-CM | POA: Diagnosis not present

## 2021-09-24 DIAGNOSIS — J310 Chronic rhinitis: Secondary | ICD-10-CM | POA: Diagnosis not present

## 2021-09-24 DIAGNOSIS — D803 Selective deficiency of immunoglobulin G [IgG] subclasses: Secondary | ICD-10-CM | POA: Diagnosis not present

## 2021-09-24 DIAGNOSIS — F411 Generalized anxiety disorder: Secondary | ICD-10-CM | POA: Diagnosis not present

## 2021-09-24 DIAGNOSIS — R5383 Other fatigue: Secondary | ICD-10-CM | POA: Diagnosis not present

## 2021-09-24 DIAGNOSIS — E7800 Pure hypercholesterolemia, unspecified: Secondary | ICD-10-CM | POA: Diagnosis not present

## 2021-09-24 DIAGNOSIS — R0600 Dyspnea, unspecified: Secondary | ICD-10-CM | POA: Diagnosis not present

## 2021-09-24 DIAGNOSIS — J479 Bronchiectasis, uncomplicated: Secondary | ICD-10-CM | POA: Diagnosis not present

## 2021-09-24 DIAGNOSIS — J4540 Moderate persistent asthma, uncomplicated: Secondary | ICD-10-CM | POA: Diagnosis not present

## 2021-09-25 DIAGNOSIS — J479 Bronchiectasis, uncomplicated: Secondary | ICD-10-CM | POA: Diagnosis not present

## 2021-09-25 DIAGNOSIS — D803 Selective deficiency of immunoglobulin G [IgG] subclasses: Secondary | ICD-10-CM | POA: Diagnosis not present

## 2021-09-25 DIAGNOSIS — R0600 Dyspnea, unspecified: Secondary | ICD-10-CM | POA: Diagnosis not present

## 2021-09-25 DIAGNOSIS — R5383 Other fatigue: Secondary | ICD-10-CM | POA: Diagnosis not present

## 2021-09-25 DIAGNOSIS — J310 Chronic rhinitis: Secondary | ICD-10-CM | POA: Diagnosis not present

## 2021-09-25 DIAGNOSIS — J4540 Moderate persistent asthma, uncomplicated: Secondary | ICD-10-CM | POA: Diagnosis not present

## 2021-09-27 ENCOUNTER — Encounter: Payer: Self-pay | Admitting: Cardiology

## 2021-09-27 ENCOUNTER — Ambulatory Visit: Payer: PPO | Admitting: Cardiology

## 2021-09-27 VITALS — BP 128/76 | HR 57 | Ht 66.0 in | Wt 172.8 lb

## 2021-09-27 DIAGNOSIS — E782 Mixed hyperlipidemia: Secondary | ICD-10-CM

## 2021-09-27 DIAGNOSIS — E7800 Pure hypercholesterolemia, unspecified: Secondary | ICD-10-CM | POA: Diagnosis not present

## 2021-09-27 DIAGNOSIS — I2510 Atherosclerotic heart disease of native coronary artery without angina pectoris: Secondary | ICD-10-CM | POA: Diagnosis not present

## 2021-09-27 NOTE — Progress Notes (Signed)
Cardiology Office Note:    Date:  09/27/2021   ID:  Laurie Barr, DOB 1950/06/15, MRN [id number]  PCP:  Greig Right, MD  Cardiologist:  Jenean Lindau, MD   Referring MD: Greig Right, MD    ASSESSMENT:    1. Coronary artery disease involving native coronary artery of native heart without angina pectoris   2. Mixed dyslipidemia   3. Hypercholesterolemia    PLAN:    In order of problems listed above:  Coronary artery disease: Secondary prevention stressed with the patient.  Importance of compliance with diet medication stressed and she vocalized understanding.  She was advised to walk at least half an hour a day 5 days a week and she promises to do so. Essential hypertension: Blood pressure stable and diet was emphasized.  Lifestyle modification urged.   Mixed dyslipidemia: On lipid-lowering medications lipids were reviewed. Cigarette smoking: I spent 5 minutes with the patient discussing solely about smoking. Smoking cessation was counseled. I suggested to the patient also different medications and pharmacological interventions. Patient is keen to try stopping on its own at this time. He will get back to me if he needs any further assistance in this matter. Patient will be seen in follow-up appointment in 9 months or earlier if the patient has any concerns   Medication Adjustments/Labs and Tests Ordered: Current medicines are reviewed at length with the patient today.  Concerns regarding medicines are outlined above.  No orders of the defined types were placed in this encounter.  No orders of the defined types were placed in this encounter.    Chief Complaint  Patient presents with   left bundle branch follow up    BP fluctuation     History of Present Illness:    Laurie Barr is a 71 y.o. female.  Patient has past medical history of coronary artery disease, essential hypertension, dyslipidemia and unfortunately continues to smoke.  She denies any chest  pain orthopnea or PND and takes care of activities of daily living.  She leads a sedentary lifestyle.  At the time of my evaluation, the patient is alert awake oriented and in no distress.  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   Anxiety    Asthma    Bronchiectasis ([place]) 10/01/2015   CAD (coronary artery disease) 12/19/2019   Chest tightness 08/12/2019   Cigarette smoker 10/20/2020   COPD (chronic obstructive pulmonary disease) ([place]) 10/01/2015   Dyspnea on exertion 08/12/2019   Endometriosis    Ex-smoker 08/12/2019   GERD (gastroesophageal reflux disease)    History of colonic polyps    Hypercholesterolemia    Hypertrophy of bone, left ankle and foot 05/05/2016   IBS (irritable bowel syndrome)    Mixed dyslipidemia 08/12/2019   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 Mild sigmoid diverticulosis   CYSTOSCOPY  11/17/2016   ELBOW SURGERY Left    removed bone due to fall/ Dr. Lorin Mercy   ESOPHAGOGASTRODUODENOSCOPY  11/19/2015   Mild gastritis. Transient hiatal hernia. 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   aspirin 81 MG tablet Take 81 mg by mouth daily.    baclofen (LIORESAL) 10 MG tablet Take 10 mg by mouth 2 (two) times daily as needed for muscle spasms.   citalopram (CELEXA) 20 MG tablet Take 20 mg by mouth daily.   EPINEPHrine 0.3 mg/0.3 mL IJ SOAJ injection Inject 0.3 mg into the muscle as needed for anaphylaxis.   fluticasone (FLONASE) 50 MCG/ACT nasal spray Place 2 sprays into both nostrils daily.   HIZENTRA 10 GM/50ML SOLN Inject 1 Dose into the skin once a week.   montelukast (SINGULAIR) 10 MG tablet Take 10 mg by mouth daily.   nitroGLYCERIN (NITROSTAT) 0.4 MG SL tablet Place 0.4 mg under the tongue every 5 (five) minutes as needed for chest pain.   nystatin cream (MYCOSTATIN) Apply 1 application  topically as needed for dry skin.   pantoprazole (PROTONIX) 40 MG tablet Take 1 tablet (40 mg total) by mouth daily.   rosuvastatin (CRESTOR) 40 MG tablet Take 40 mg by mouth daily.   [DISCONTINUED] trimethoprim (TRIMPEX) 100 MG tablet Take 100 mg by mouth daily.     Allergies:   Patient has no known allergies.   Social History   Socioeconomic History   Marital status: Unknown    Spouse name: Not on file   Number of children: Not on file   Years of education: Not on file   Highest education level: Not on file  Occupational History   Not on file  Tobacco Use   Smoking status: Former    Types: Cigarettes    Quit date: 08/12/2015    Years since quitting: 6.1   Smokeless tobacco: Never  Vaping Use   Vaping Use: Never used  Substance and Sexual Activity   Alcohol use: Not Currently   Drug use: Never   Sexual activity: Not Currently  Other Topics Concern   Not on file  Social History Narrative   Not on file   Social Determinants of Health   Financial Resource Strain: Not on file  Food Insecurity: Not on file  Transportation Needs: Not on file  Physical Activity: Not on file  Stress: Not on file  Social Connections: Not on file     Family History: The patient's family history includes Cancer in her father; Diabetes in  her brother and sister; Hypertension in her brother, father, mother, and sister. There is no history of Colon cancer, Esophageal cancer, or Heart disease.  ROS:   Please see the history of present illness.    All other systems reviewed and are negative.  EKGs/Labs/Other Studies Reviewed:    The following studies were reviewed today: I discussed my findings with the patient at length.   Recent Labs: 06/03/2021: ALT 23; BUN 9; Creatinine, Ser 0.83; Hemoglobin 15.0; Platelets 225; Potassium 4.5; Sodium 139; TSH 2.330  Recent Lipid Panel    Component Value Date/Time   CHOL 160 06/03/2021 1108   TRIG 186 (H) 06/03/2021 1108   HDL 68 06/03/2021 1108   CHOLHDL 2.4 06/03/2021 1108  [place]ers 62 06/03/2021 1108    Physical Exam:    VS:  BP 128/76 (BP Location: Left Arm, Patient Position: Sitting)   Pulse (!) 57   Ht '5\' 6"'$  (1.676 m)   Wt 172 lb 12.8 oz (78.4 kg)   SpO2 97%   BMI 27.89 kg/m     Wt Readings from Last 3 Encounters:  09/27/21 172 lb 12.8 oz (78.4 kg)  06/03/21 167 lb 9.6 oz (76 kg)  12/01/20 165 lb 3.2 oz (74.9 kg)     GEN: Patient is in no acute distress HEENT: Normal NECK: No JVD; No carotid bruits LYMPHATICS: No lymphadenopathy CARDIAC: Hear sounds regular, 2/6 systolic murmur at the apex. RESPIRATORY:  Clear to auscultation without rales, wheezing or rhonchi  ABDOMEN: Soft, non-tender, non-distended MUSCULOSKELETAL:  No edema; No deformity  SKIN: Warm and dry NEUROLOGIC:  Alert and oriented x 3 PSYCHIATRIC:  Normal affect   Signed, Jenean Lindau, MD  09/27/2021 11:59 AM    [facility]

## 2021-09-27 NOTE — Addendum Note (Signed)
Addended by: Darrel Reach on: 09/27/2021 12:06 PM   Modules accepted: Orders

## 2021-09-27 NOTE — Patient Instructions (Signed)
Medication Instructions:  Your physician recommends that you continue on your current medications as directed. Please refer to the Current Medication list given to you today.  *If you need a refill on your cardiac medications before your next appointment, please call your pharmacy*   Lab Work: None If you have labs (blood work) drawn today and your tests are completely normal, you will receive your results only by: [place] (if you have MyChart) OR A paper copy in the mail If you have any lab test that is abnormal or we need to change your treatment, we will call you to review the results.   Testing/Procedures: Your physician has requested that you have an echocardiogram. Echocardiography is a painless test that uses sound waves to create images of your heart. It provides your doctor with information about the size and shape of your heart and how well your heart's chambers and valves are working. This procedure takes approximately one hour. There are no restrictions for this procedure.     Follow-Up: At [facility], you and your health needs are our priority.  As part of our continuing mission to provide you with exceptional heart care, we have created designated Provider Care Teams.  These Care Teams include your primary Cardiologist (physician) and Advanced Practice Providers (APPs -  Physician Assistants and Nurse Practitioners) who all work together to provide you with the care you need, when you need it.  We recommend signing up for the patient portal called "MyChart".  Sign up information is provided on this After Visit Summary.  MyChart is used to connect with patients for Virtual Visits (Telemedicine).  Patients are able to view lab/test results, encounter notes, upcoming appointments, etc.  Non-urgent messages can be sent to your provider as well.   To learn more about what you can do with MyChart, go to [email or web address].    Your next appointment:   9  month(s)  The format for your next appointment:   In Person Provider:   Jyl Heinz, MD    Other Instructions   Important Information About Sugar

## 2021-10-10 DIAGNOSIS — J45909 Unspecified asthma, uncomplicated: Secondary | ICD-10-CM | POA: Diagnosis not present

## 2021-10-10 DIAGNOSIS — J040 Acute laryngitis: Secondary | ICD-10-CM | POA: Diagnosis not present

## 2021-10-11 ENCOUNTER — Other Ambulatory Visit: Payer: PPO

## 2021-10-14 IMAGING — CT CT HEART MORP W/ CTA COR W/ SCORE W/ CA W/CM &/OR W/O CM
1 of 8 series · 9 of 20 positions shown, 12 images · IV contrast (APPLIED)
Comparison: None.
COMPARISON: None.

Addendum:
EXAM:
OVER-READ INTERPRETATION  CT CHEST

The following report is an over-read performed by radiologist Dr.
Francijn Ibis [REDACTED] on 09/18/2019. This
over-read does not include interpretation of cardiac or coronary
anatomy or pathology. The coronary calcium score/coronary CTA
interpretation by the cardiologist is attached.
CLINICAL DATA: Chest pain
Cardiac/Coronary CTA
TECHNIQUE: The patient was scanned on a Phillips Force scanner. A 100 kV
prospective scan was triggered in the descending thoracic aorta at
111 HU's. Axial non-contrast 3 mm slices were carried out through
the heart. The data set was analyzed on a dedicated work station and
scored using the Agatson method. Gantry rotation speed was 250 msecs
and collimation was .6 mm. No beta blockade and 0.8 mg of sl NTG was
given. The 3D data set was reconstructed in 5% intervals of the
35-75 % of the R-R cycle. Diastolic phases were analyzed on a
dedicated work station using MPR, MIP and VRT modes. The patient
received 80 cc of contrast.

[Series 10: multiphase · axial · 0.39mm/px · z∈[-166,-64]mm · 9 of 2332 slices shown, 12 images]
[im 234/2332  vessel]
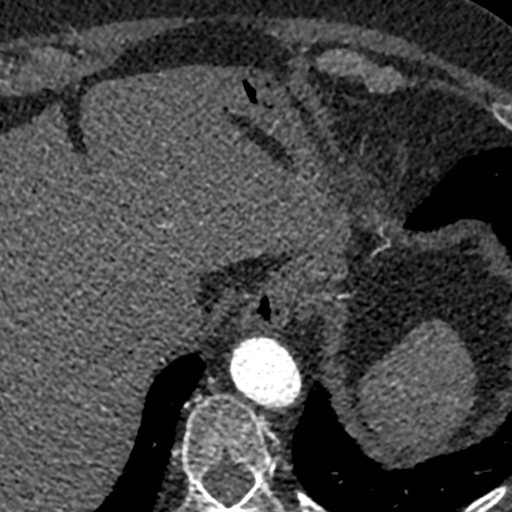
[im 234/2332  lung]
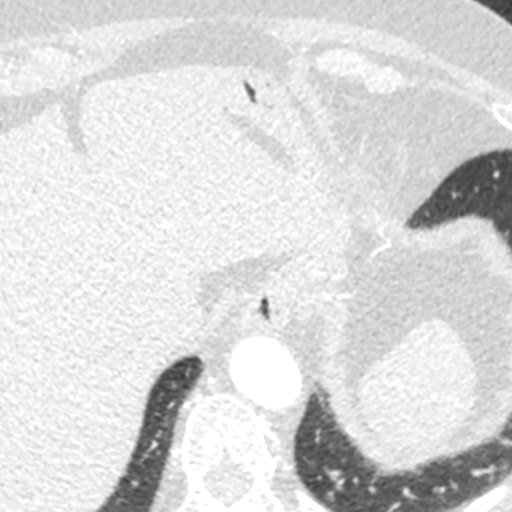
[im 467/2332  vessel]
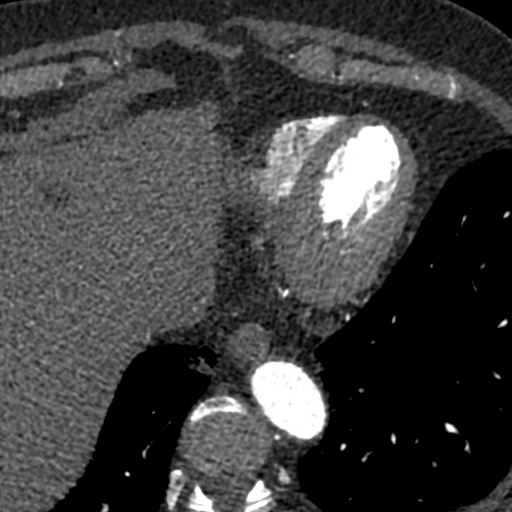
[im 700/2332  vessel]
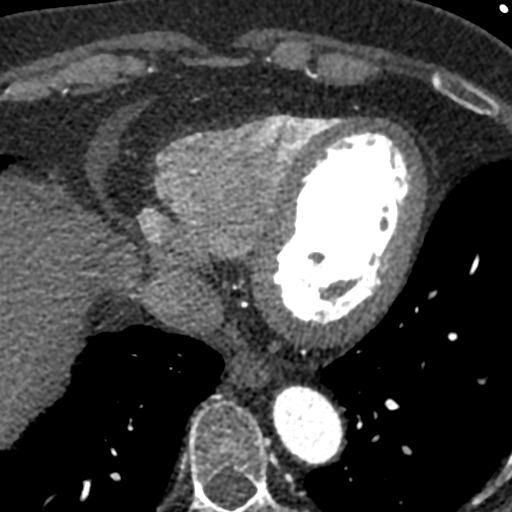
[im 933/2332  vessel]
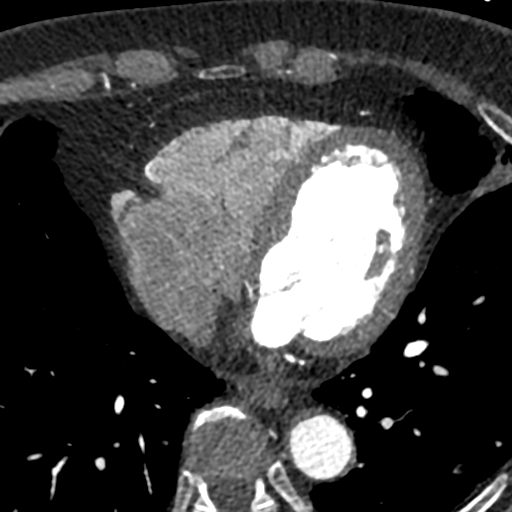
[im 1166/2332  vessel]
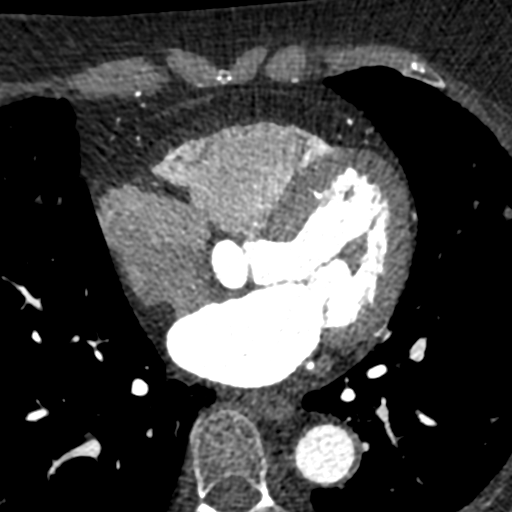
[im 1166/2332  lung]
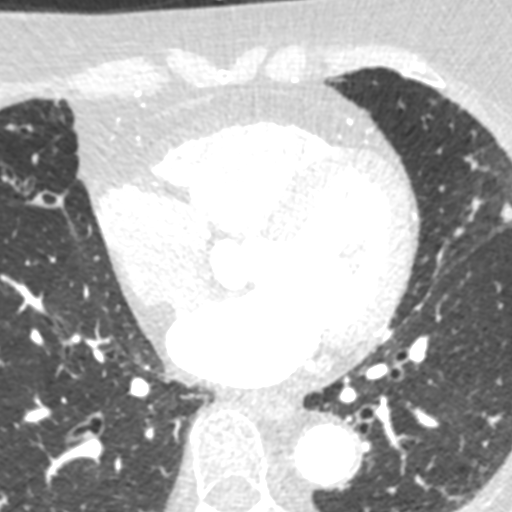
[im 1399/2332  vessel]
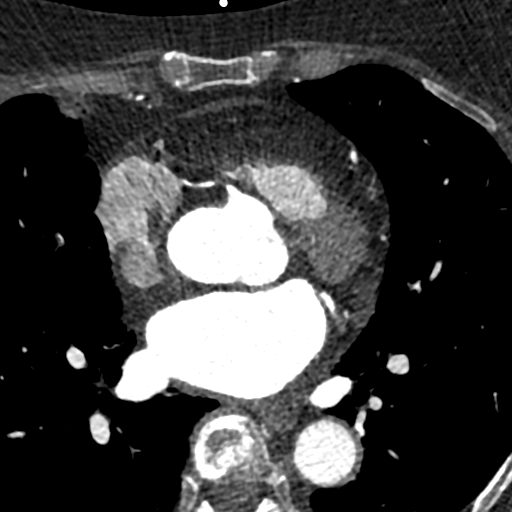
[im 1632/2332  vessel]
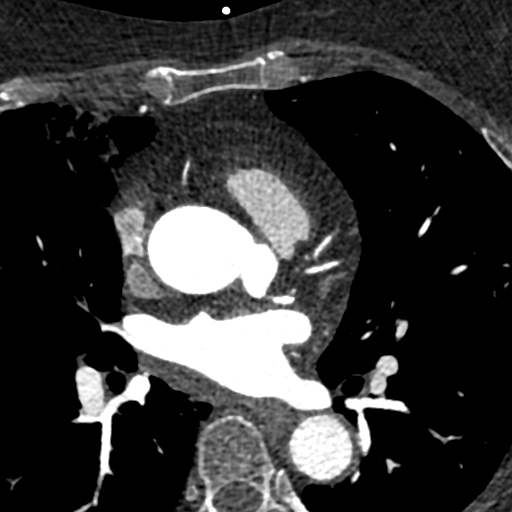
[im 1865/2332  vessel]
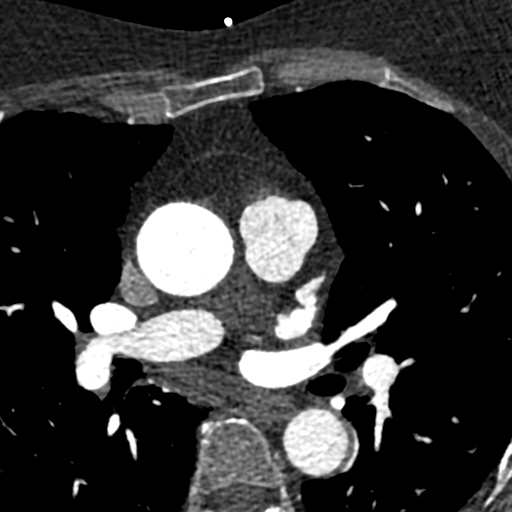
[im 2098/2332  vessel]
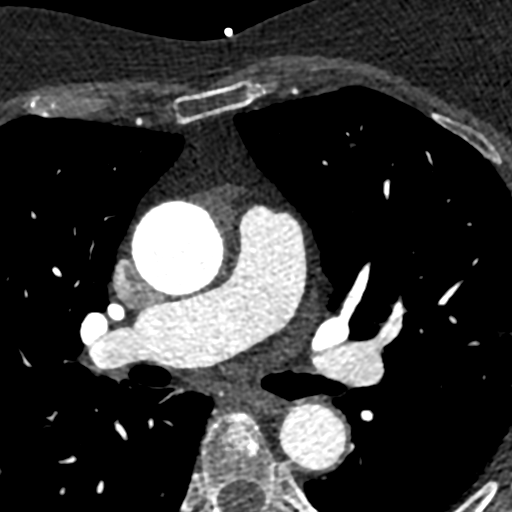
[im 2098/2332  lung]
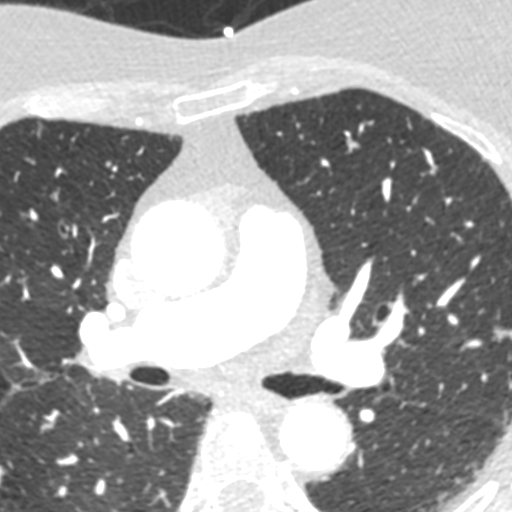

[9 of 20 positions shown; findings below may reference images not displayed]

FINDINGS: Aortic atherosclerosis. Widespread areas of cylindrical
bronchiectasis, many of which have profound thickening of the
peribronchovascular interstitium as well as some clustered
peribronchovascular micro nodularity. Within the visualized portions
of the thorax there are no suspicious appearing pulmonary nodules or
masses, there is no acute consolidative airspace disease, no pleural
effusions, no pneumothorax and no lymphadenopathy. Visualized
portions of the upper abdomen demonstrates a 1.2 cm intermediate
attenuation lesion in segment 4A of the liver, likely to represent a
small proteinaceous cyst. There are no aggressive appearing lytic or
blastic lesions noted in the visualized portions of the skeleton.
IMPRESSION: 1.  Aortic Atherosclerosis (775JF-N8B.B).
2. The appearance of the lungs suggests a chronic indolent atypical
infectious process such as JUMPER (mycobacterium avium intracellulare)
as detailed above.
FINDINGS: Image quality: excellent.

Noise artifact is: Limited.

Coronary Arteries:  Normal coronary origin.  Left dominance.

Left main: The left main is a short, large caliber vessel with a
normal take off from the left coronary cusp that bifurcates to form
a left anterior descending artery and a left circumflex artery.
There is minimal calcified plaque (<25%).

Left anterior descending artery: The proximal LAD contains mild,
mixed density plaque (25-49%). The mid LAD contains moderate
non-calcified plaque (50-69%). The distal LAD is patent. The first
diagonal contains moderate non-calcified plaque (50-69%).

Left circumflex artery: The LCX is dominant and patent with no
evidence of plaque or stenosis. OM1 contains minimal non-calcified
plaque (<25%). OM2 and OM3 are patent. The LCX terminates as a
patent PDA branch.

Right coronary artery: The RCA is non-dominant with normal take off
from the right coronary cusp. There is no evidence of plaque or
stenosis.

Right Atrium: Right atrial size is within normal limits.

Right Ventricle: The right ventricular cavity is within normal
limits.

Left Atrium: Left atrial size is normal in size with no left atrial
appendage filling defect.

Left Ventricle: The ventricular cavity size is within normal limits.
There are no stigmata of prior infarction. There is no abnormal
filling defect.

Pulmonary arteries: Normal in size without proximal filling defect.

Pulmonary veins: Normal pulmonary venous drainage.

Pericardium: Normal thickness with no significant effusion or
calcium present.

Cardiac valves: The aortic valve is trileaflet without significant
calcification. The mitral valve is normal structure without
significant calcification.

Aorta: Normal caliber with no significant disease.

Extra-cardiac findings: See attached radiology report for
non-cardiac structures.
IMPRESSION: 1. Coronary calcium score of 71. This was 70th percentile for age
and sex matched controls.

2. Normal coronary origin with left dominance.

3. Moderate stenoses in the mid LAD and first diagonal (50-69%).

RECOMMENDATIONS:
1. Moderate stenosis. Consider symptom-guided anti-ischemic
pharmacotherapy as well as risk factor modification per guideline
directed care. Additional analysis with CT FFR will be submitted.

*** End of Addendum ***
EXAM:
OVER-READ INTERPRETATION  CT CHEST

The following report is an over-read performed by radiologist Dr.
Francijn Ibis [REDACTED] on 09/18/2019. This
over-read does not include interpretation of cardiac or coronary
anatomy or pathology. The coronary calcium score/coronary CTA
interpretation by the cardiologist is attached.
FINDINGS: Aortic atherosclerosis. Widespread areas of cylindrical
bronchiectasis, many of which have profound thickening of the
peribronchovascular interstitium as well as some clustered
peribronchovascular micro nodularity. Within the visualized portions
of the thorax there are no suspicious appearing pulmonary nodules or
masses, there is no acute consolidative airspace disease, no pleural
effusions, no pneumothorax and no lymphadenopathy. Visualized
portions of the upper abdomen demonstrates a 1.2 cm intermediate
attenuation lesion in segment 4A of the liver, likely to represent a
small proteinaceous cyst. There are no aggressive appearing lytic or
blastic lesions noted in the visualized portions of the skeleton.
IMPRESSION: 1.  Aortic Atherosclerosis (775JF-N8B.B).
2. The appearance of the lungs suggests a chronic indolent atypical
infectious process such as JUMPER (mycobacterium avium intracellulare)
as detailed above.

## 2021-10-20 DIAGNOSIS — Z79899 Other long term (current) drug therapy: Secondary | ICD-10-CM | POA: Diagnosis not present

## 2021-10-20 DIAGNOSIS — Z6832 Body mass index (BMI) 32.0-32.9, adult: Secondary | ICD-10-CM | POA: Diagnosis not present

## 2021-10-20 DIAGNOSIS — K219 Gastro-esophageal reflux disease without esophagitis: Secondary | ICD-10-CM | POA: Diagnosis not present

## 2021-10-20 DIAGNOSIS — F17200 Nicotine dependence, unspecified, uncomplicated: Secondary | ICD-10-CM | POA: Diagnosis not present

## 2021-10-20 DIAGNOSIS — F411 Generalized anxiety disorder: Secondary | ICD-10-CM | POA: Diagnosis not present

## 2021-10-20 DIAGNOSIS — E7800 Pure hypercholesterolemia, unspecified: Secondary | ICD-10-CM | POA: Diagnosis not present

## 2021-10-20 DIAGNOSIS — M810 Age-related osteoporosis without current pathological fracture: Secondary | ICD-10-CM | POA: Diagnosis not present

## 2021-10-21 DIAGNOSIS — J209 Acute bronchitis, unspecified: Secondary | ICD-10-CM | POA: Diagnosis not present

## 2021-10-21 DIAGNOSIS — R071 Chest pain on breathing: Secondary | ICD-10-CM | POA: Diagnosis not present

## 2021-11-16 DIAGNOSIS — R5383 Other fatigue: Secondary | ICD-10-CM | POA: Diagnosis not present

## 2021-11-16 DIAGNOSIS — J310 Chronic rhinitis: Secondary | ICD-10-CM | POA: Diagnosis not present

## 2021-11-16 DIAGNOSIS — R0600 Dyspnea, unspecified: Secondary | ICD-10-CM | POA: Diagnosis not present

## 2021-11-16 DIAGNOSIS — F17210 Nicotine dependence, cigarettes, uncomplicated: Secondary | ICD-10-CM | POA: Diagnosis not present

## 2021-11-16 DIAGNOSIS — J4540 Moderate persistent asthma, uncomplicated: Secondary | ICD-10-CM | POA: Diagnosis not present

## 2021-11-16 DIAGNOSIS — J479 Bronchiectasis, uncomplicated: Secondary | ICD-10-CM | POA: Diagnosis not present

## 2021-11-22 ENCOUNTER — Other Ambulatory Visit: Payer: PPO

## 2021-11-23 ENCOUNTER — Ambulatory Visit: Payer: PPO | Attending: Cardiology

## 2021-11-23 DIAGNOSIS — I2510 Atherosclerotic heart disease of native coronary artery without angina pectoris: Secondary | ICD-10-CM | POA: Diagnosis not present

## 2021-11-23 LAB — ECHOCARDIOGRAM COMPLETE
Area-P 1/2: 3.23 cm2
Calc EF: 52.7 %
S' Lateral: 3.3 cm
Single Plane A2C EF: 55.9 %
Single Plane A4C EF: 50.9 %

## 2021-11-24 DIAGNOSIS — R0600 Dyspnea, unspecified: Secondary | ICD-10-CM | POA: Diagnosis not present

## 2021-11-24 DIAGNOSIS — J479 Bronchiectasis, uncomplicated: Secondary | ICD-10-CM | POA: Diagnosis not present

## 2021-11-24 DIAGNOSIS — F17210 Nicotine dependence, cigarettes, uncomplicated: Secondary | ICD-10-CM | POA: Diagnosis not present

## 2021-11-24 DIAGNOSIS — R5383 Other fatigue: Secondary | ICD-10-CM | POA: Diagnosis not present

## 2021-11-24 DIAGNOSIS — J4540 Moderate persistent asthma, uncomplicated: Secondary | ICD-10-CM | POA: Diagnosis not present

## 2021-11-24 DIAGNOSIS — J310 Chronic rhinitis: Secondary | ICD-10-CM | POA: Diagnosis not present

## 2021-11-26 DIAGNOSIS — J432 Centrilobular emphysema: Secondary | ICD-10-CM | POA: Diagnosis not present

## 2021-11-26 DIAGNOSIS — I2510 Atherosclerotic heart disease of native coronary artery without angina pectoris: Secondary | ICD-10-CM | POA: Diagnosis not present

## 2021-11-26 DIAGNOSIS — J479 Bronchiectasis, uncomplicated: Secondary | ICD-10-CM | POA: Diagnosis not present

## 2021-11-26 DIAGNOSIS — K8020 Calculus of gallbladder without cholecystitis without obstruction: Secondary | ICD-10-CM | POA: Diagnosis not present

## 2021-11-26 DIAGNOSIS — R911 Solitary pulmonary nodule: Secondary | ICD-10-CM | POA: Diagnosis not present

## 2021-12-16 DIAGNOSIS — R339 Retention of urine, unspecified: Secondary | ICD-10-CM | POA: Diagnosis not present

## 2021-12-16 DIAGNOSIS — N952 Postmenopausal atrophic vaginitis: Secondary | ICD-10-CM | POA: Diagnosis not present

## 2021-12-16 DIAGNOSIS — N390 Urinary tract infection, site not specified: Secondary | ICD-10-CM | POA: Diagnosis not present

## 2021-12-16 DIAGNOSIS — B3731 Acute candidiasis of vulva and vagina: Secondary | ICD-10-CM | POA: Diagnosis not present

## 2021-12-21 ENCOUNTER — Ambulatory Visit: Payer: PPO | Admitting: Gastroenterology

## 2021-12-28 DIAGNOSIS — R070 Pain in throat: Secondary | ICD-10-CM | POA: Diagnosis not present

## 2021-12-28 DIAGNOSIS — J069 Acute upper respiratory infection, unspecified: Secondary | ICD-10-CM | POA: Diagnosis not present

## 2022-01-05 DIAGNOSIS — J479 Bronchiectasis, uncomplicated: Secondary | ICD-10-CM | POA: Diagnosis not present

## 2022-01-05 DIAGNOSIS — J4540 Moderate persistent asthma, uncomplicated: Secondary | ICD-10-CM | POA: Diagnosis not present

## 2022-01-05 DIAGNOSIS — F17210 Nicotine dependence, cigarettes, uncomplicated: Secondary | ICD-10-CM | POA: Diagnosis not present

## 2022-01-05 DIAGNOSIS — R0600 Dyspnea, unspecified: Secondary | ICD-10-CM | POA: Diagnosis not present

## 2022-01-05 DIAGNOSIS — J301 Allergic rhinitis due to pollen: Secondary | ICD-10-CM | POA: Diagnosis not present

## 2022-01-05 DIAGNOSIS — R5383 Other fatigue: Secondary | ICD-10-CM | POA: Diagnosis not present

## 2022-01-05 DIAGNOSIS — J310 Chronic rhinitis: Secondary | ICD-10-CM | POA: Diagnosis not present

## 2022-01-25 DIAGNOSIS — E7800 Pure hypercholesterolemia, unspecified: Secondary | ICD-10-CM | POA: Diagnosis not present

## 2022-01-25 DIAGNOSIS — F17200 Nicotine dependence, unspecified, uncomplicated: Secondary | ICD-10-CM | POA: Diagnosis not present

## 2022-01-25 DIAGNOSIS — Z6831 Body mass index (BMI) 31.0-31.9, adult: Secondary | ICD-10-CM | POA: Diagnosis not present

## 2022-01-26 DIAGNOSIS — N952 Postmenopausal atrophic vaginitis: Secondary | ICD-10-CM | POA: Diagnosis not present

## 2022-01-26 DIAGNOSIS — R339 Retention of urine, unspecified: Secondary | ICD-10-CM | POA: Diagnosis not present

## 2022-01-26 DIAGNOSIS — N390 Urinary tract infection, site not specified: Secondary | ICD-10-CM | POA: Diagnosis not present

## 2022-01-26 DIAGNOSIS — B3731 Acute candidiasis of vulva and vagina: Secondary | ICD-10-CM | POA: Diagnosis not present

## 2022-02-10 DIAGNOSIS — K219 Gastro-esophageal reflux disease without esophagitis: Secondary | ICD-10-CM | POA: Diagnosis not present

## 2022-02-10 DIAGNOSIS — Z0000 Encounter for general adult medical examination without abnormal findings: Secondary | ICD-10-CM | POA: Diagnosis not present

## 2022-02-10 DIAGNOSIS — J479 Bronchiectasis, uncomplicated: Secondary | ICD-10-CM | POA: Diagnosis not present

## 2022-02-10 DIAGNOSIS — Z1389 Encounter for screening for other disorder: Secondary | ICD-10-CM | POA: Diagnosis not present

## 2022-02-10 DIAGNOSIS — I700 Atherosclerosis of aorta: Secondary | ICD-10-CM | POA: Diagnosis not present

## 2022-02-10 DIAGNOSIS — E663 Overweight: Secondary | ICD-10-CM | POA: Diagnosis not present

## 2022-02-10 DIAGNOSIS — F17200 Nicotine dependence, unspecified, uncomplicated: Secondary | ICD-10-CM | POA: Diagnosis not present

## 2022-02-10 DIAGNOSIS — Z6829 Body mass index (BMI) 29.0-29.9, adult: Secondary | ICD-10-CM | POA: Diagnosis not present

## 2022-02-10 DIAGNOSIS — Z2821 Immunization not carried out because of patient refusal: Secondary | ICD-10-CM | POA: Diagnosis not present

## 2022-02-10 DIAGNOSIS — F411 Generalized anxiety disorder: Secondary | ICD-10-CM | POA: Diagnosis not present

## 2022-02-10 DIAGNOSIS — E7800 Pure hypercholesterolemia, unspecified: Secondary | ICD-10-CM | POA: Diagnosis not present

## 2022-02-15 ENCOUNTER — Ambulatory Visit: Payer: PPO | Admitting: Gastroenterology

## 2022-02-15 ENCOUNTER — Encounter: Payer: Self-pay | Admitting: Gastroenterology

## 2022-02-15 ENCOUNTER — Other Ambulatory Visit (INDEPENDENT_AMBULATORY_CARE_PROVIDER_SITE_OTHER): Payer: PPO

## 2022-02-15 VITALS — BP 126/78 | HR 59 | Ht 66.0 in | Wt 177.0 lb

## 2022-02-15 DIAGNOSIS — K449 Diaphragmatic hernia without obstruction or gangrene: Secondary | ICD-10-CM | POA: Diagnosis not present

## 2022-02-15 DIAGNOSIS — R140 Abdominal distension (gaseous): Secondary | ICD-10-CM

## 2022-02-15 DIAGNOSIS — K219 Gastro-esophageal reflux disease without esophagitis: Secondary | ICD-10-CM

## 2022-02-15 DIAGNOSIS — R1013 Epigastric pain: Secondary | ICD-10-CM | POA: Diagnosis not present

## 2022-02-15 DIAGNOSIS — R1084 Generalized abdominal pain: Secondary | ICD-10-CM

## 2022-02-15 LAB — COMPREHENSIVE METABOLIC PANEL
ALT: 18 U/L (ref 0–35)
AST: 24 U/L (ref 0–37)
Albumin: 4.3 g/dL (ref 3.5–5.2)
Alkaline Phosphatase: 55 U/L (ref 39–117)
BUN: 8 mg/dL (ref 6–23)
CO2: 28 mEq/L (ref 19–32)
Calcium: 8.8 mg/dL (ref 8.4–10.5)
Chloride: 101 mEq/L (ref 96–112)
Creatinine, Ser: 0.76 mg/dL (ref 0.40–1.20)
GFR: 78.8 mL/min (ref >60.00)
Glucose, Bld: 95 mg/dL (ref 70–99)
Potassium: 4 mEq/L (ref 3.5–5.1)
Sodium: 135 mEq/L (ref 135–145)
Total Bilirubin: 1.2 mg/dL (ref 0.2–1.2)
Total Protein: 6.9 g/dL (ref 6.0–8.3)

## 2022-02-15 LAB — CBC WITH DIFFERENTIAL/PLATELET
Basophils Absolute: 0.1 10*3/uL (ref 0.0–0.1)
Basophils Relative: 0.8 % (ref 0.0–3.0)
Eosinophils Absolute: 0.1 10*3/uL (ref 0.0–0.7)
Eosinophils Relative: 1.5 % (ref 0.0–5.0)
HCT: 41.9 % (ref 36.0–46.0)
Hemoglobin: 14.3 g/dL (ref 12.0–15.0)
Lymphocytes Relative: 13.8 % (ref 12.0–46.0)
Lymphs Abs: 1.1 10*3/uL (ref 0.7–4.0)
MCHC: 34.1 g/dL (ref 30.0–36.0)
MCV: 91.4 fl (ref 78.0–100.0)
Monocytes Absolute: 0.6 10*3/uL (ref 0.1–1.0)
Monocytes Relative: 7.6 % (ref 3.0–12.0)
Neutro Abs: 6 10*3/uL (ref 1.4–7.7)
Neutrophils Relative %: 76.3 % (ref 43.0–77.0)
Platelets: 218 10*3/uL (ref 150.0–400.0)
RBC: 4.58 Mil/uL (ref 3.87–5.11)
RDW: 14.7 % (ref 11.5–15.5)
WBC: 7.9 10*3/uL (ref 4.0–10.5)

## 2022-02-15 MED ORDER — DICYCLOMINE HCL 10 MG PO CAPS: 10.0000 mg | ORAL_CAPSULE | Freq: Two times a day (BID) | ORAL | 3 refills | Status: DC

## 2022-02-15 MED ORDER — PANTOPRAZOLE SODIUM 40 MG PO TBEC: 40.0000 mg | DELAYED_RELEASE_TABLET | Freq: Every day | ORAL | 11 refills | Status: DC

## 2022-02-15 NOTE — Patient Instructions (Addendum)
_______________________________________________________  If you are age 71 or older, your body mass index should be between 23-30. Your Body mass index is 28.57 kg/m. If this is out of the aforementioned range listed, please consider follow up with your Primary Care Provider.  If you are age 76 or younger, your body mass index should be between 19-25. Your Body mass index is 28.57 kg/m. If this is out of the aformentioned range listed, please consider follow up with your Primary Care Provider.   ________________________________________________________  The [facility] providers would like to encourage you to use [facility] to communicate with providers for non-urgent requests or questions.  Due to long hold times on the telephone, sending your provider a message by [facility] may be a faster and more efficient way to get a response.  Please allow 48 business hours for a response.  Please remember that this is for non-urgent requests.  _______________________________________________________  Your provider has requested that you go to the basement level for lab work before leaving today. Press "B" on the elevator. The lab is located at the first door on the left as you exit the elevator.  We have sent the following medications to your pharmacy for you to pick up at your convenience: Bentyl Protonix  You have been scheduled [facility] CT [facility] [place] [place] [address]   You are scheduled on 02-21-2022 at 330pm. You should arrive 30 minutes prior to your appointment time for registration. Please follow the written instructions below on the day of your exam:  WARNING: IF YOU ARE ALLERGIC TO IODINE/X-RAY DYE, PLEASE NOTIFY RADIOLOGY IMMEDIATELY AT [phone]! YOU WILL BE GIVEN A 13 HOUR PREMEDICATION PREP.  1) Do not eat anything after 1130am (4 hours prior to your test)  You may take any medications as prescribed with a small amount of  water, if necessary. If you take any of the following medications: METFORMIN, GLUCOPHAGE, GLUCOVANCE, AVANDAMET, RIOMET, FORTAMET, [place] MET, JANUMET, GLUMETZA or METAGLIP, you MAY be asked to HOLD this medication 48 hours AFTER the exam.  The purpose of you drinking the oral contrast is to aid in the visualization of your intestinal tract. The contrast solution may cause some diarrhea. Depending on your individual set of symptoms, you may also receive an intravenous injection of x-ray contrast/dye. Plan on being at [facility] for 30 minutes or longer, depending on the type of exam you are having performed.  This test typically takes 30-45 minutes to complete.  If you have any questions regarding your exam or if you need to reschedule, you may call the CT department at [phone] between the hours of 8:00 am and 5:00 pm, Monday-Friday.  ________________________________________________________________________  Thank you,  Dr. Jackquline Denmark

## 2022-02-15 NOTE — Progress Notes (Signed)
Chief Complaint: FU  Referring Provider:  Greig Right, MD      ASSESSMENT AND PLAN;   #1. Postprandial abdo pain with bloating.   #2. GERD with HH with epi pain  #3. IBS with alt constipation/diarrhea. Neg colon 10/2019  Plan: -Continue protonix 23m po qd, #30, 11 refills -CBC, CMP -CTA abdo/pelvis (r/o mesenteric stenosis) -If neg and still with problems, solid-phase GES -Bentyl 123mpo BID #60    HPI:    Laurie Barr a [age].o. female  For follow-up visit.  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  This would last for 2 to 3 hours.  She continues to have abdominal bloating.  Alternating diarrhea and constipation with mucus in the stools.  No melena or hematochezia.  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  No heartburn since she has been on Protonix.  Being evaluated for chest pains and bradycardia by Dr. ReGeraldo Pitternderwent CTA heart showing moderate LAD stenosis.  No nausea, vomiting, regurgitation, weight loss.  No jaundice dark urine or pale stools.   Past GI procedures: CTA Chest IMPRESSION: 1. Coronary calcium score of 71. This was 70th percentile for age and sex matched controls. 2. Normal coronary origin with left dominance. 3. Moderate stenoses in the mid LAD and first diagonal (50-69%).   RECOMMENDATIONS: 1. Moderate stenosis. Consider symptom-guided anti-ischemic pharmacotherapy as well as risk factor modification per guideline directed care. Additional analysis with CT FFR will be  submitted.  EGD 10/2019 - Small hiatal hernia. - Mild gastritis. Bx- neg - Incidental non-bleeding duodenal diverticulum.  Colon 10/2019 - Two 2 to 6 mm polyps in the distal sigmoid colon, removed with a cold snare. Resected and retrieved. Bx-hyperplastic polyps. No need to repeat unless new problems d/e age. - Mild predominantly sigmoid diverticulosis. - Otherwise normal colonoscopy to TI.    Previous GI work-up: -CT Abdo/pelvis 03/05/2015: neg -EGD 10/2015: Small transient HH, mild gastritis.  Negative CLOtest.  Negative esophageal biopsies for EOE. -Colonoscopy 10/2013 ([facility]) colonic polyp s/p polypectomy, mild sigmoid diverticulosis.  Biopsies tubular adenoma.  Repeat in 5 years. 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   Anxiety    Asthma    [place]   CAD (coronary artery disease) 12/19/2019   Chest tightness 08/12/2019   Cigarette smoker 10/20/2020   COPD [place] disease) [place]   Dyspnea on exertion 08/12/2019   Endometriosis    Ex-smoker 08/12/2019   GERD (gastroesophageal reflux disease)    History of colonic polyps    Hypercholesterolemia    Hypertrophy of bone, left ankle and foot 05/05/2016   IBS (irritable bowel syndrome)    Mixed dyslipidemia 08/12/2019   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 Mild sigmoid diverticulosis   CYSTOSCOPY  11/17/2016   ELBOW SURGERY Left    removed bone due to fall/ Dr. Lorin Mercy   ESOPHAGOGASTRODUODENOSCOPY  11/19/2015   Mild  gastritis. Transient hiatal hernia. Otherwise, normal EGD   HAND SURGERY Right    removed cyst   LAPAROTOMY     multiple due to endometriosis fibrous tumors and adhesions   STOMACH SURGERY     Tumor removed size of grapefruit    Family History  Problem Relation Age of Onset   Hypertension Mother    Hypertension Father    Cancer Father    Hypertension Sister    Diabetes Sister    Diabetes Brother    Hypertension Brother    Colon cancer Neg Hx    Esophageal cancer Neg Hx    Heart disease Neg Hx     Social History   Tobacco Use   Smoking status: Former    Types: Cigarettes    Quit date: 08/12/2015    Years since quitting: 6.5   Smokeless tobacco: Never  Vaping Use   Vaping Use: Never used  Substance Use Topics   Alcohol use: Not Currently   Drug use: Never    Current Outpatient Medications  Medication Sig Dispense Refill   albuterol (PROVENTIL HFA;VENTOLIN HFA) 108 (90 Base) MCG/ACT inhaler Inhale 2 puffs into the lungs every 6 (six) hours as needed for wheezing or shortness of breath.     aspirin 81 MG tablet Take 81 mg by mouth daily.     baclofen (LIORESAL) 10 MG tablet Take 10 mg by mouth 2 (two) times daily as needed for muscle spasms.     citalopram (CELEXA) 20 MG tablet Take 20 mg by mouth daily.     fluticasone (FLONASE) 50 MCG/ACT nasal spray Place 2 sprays into both nostrils daily.     montelukast (SINGULAIR) 10 MG tablet Take 10 mg by mouth daily.     nitroGLYCERIN (NITROSTAT) 0.4 MG SL tablet Place 0.4 mg under the tongue every 5 (five) minutes as needed for chest pain.     pantoprazole (PROTONIX) 40 MG tablet Take 1 tablet (40 mg total) by mouth daily. 30 tablet 11   rosuvastatin (CRESTOR) 40 MG tablet Take 40 mg by mouth daily.     EPINEPHrine 0.3 mg/0.3 mL IJ SOAJ injection Inject 0.3 mg into the muscle as needed for anaphylaxis. (Patient not taking: Reported on 02/15/2022)     HIZENTRA 10 GM/50ML SOLN Inject 1 Dose into the skin once a week. (Patient not  taking: Reported on 02/15/2022)     nystatin cream (MYCOSTATIN) Apply 1 application  topically as needed for dry skin. (Patient not taking: Reported on 02/15/2022)     No current facility-administered medications for this visit.    No Known Allergies  Review of Systems:  Neg      Physical Exam:    BP 126/78   Pulse (!) 59   Ht _0  (1.676 m)   Wt 177 lb (80.3 kg)   BMI 28.57 kg/m  Wt Readings from Last 3 Encounters:  02/15/22 177 lb (80.3 kg)  09/27/21 172 lb 12.8 oz (78.4 kg)  06/03/21 167 lb 9.6 oz (76 kg)   Constitutional:  Well-developed, in no acute distress. Psychiatric: Normal mood and affect. Behavior is normal. HEENT: Pupils normal.  Conjunctivae are normal. No scleral icterus. Neck supple.  Cardiovascular: Normal rate, regular rhythm. No edema Pulmonary/chest: Effort normal.  Bilateral decreased breath sounds. Abdominal: Soft, nondistended.  Mild epigastric tenderness.  Bowel sounds active throughout. There are no masses palpable. No hepatomegaly. Rectal:  defered Neurological: Alert and oriented to person place and time. Skin: Skin is warm and dry. No rashes noted.  Data Reviewed: I have personally reviewed following labs and imaging studies  CBC:    Latest Ref Rng & Units 06/03/2021   11:08 AM 12/01/2020    9:31 AM 04/21/2020   11:29 AM  CBC  WBC 3.4 - 10.8 x10E3/uL 7.6  7.2  6.3   Hemoglobin 11.1 - 15.9 g/dL 15.0  14.1  13.8   Hematocrit 34.0 - 46.6 % 43.5  41.4  41.9   Platelets 150 - 450 x10E3/uL 225  211  218     CMP:    Latest Ref Rng & Units 06/03/2021   11:08 AM 12/01/2020    9:31 AM 07/14/2020    8:41 AM  CMP  Glucose 70 - 99 mg/dL 99  95  98   BUN 8 - 27 mg/dL _0 Creatinine 0.57 - 1.00 mg/dL 0.83  0.78  0.74   Sodium 134 - 144 mmol/L 139  139  140   Potassium 3.5 - 5.2 mmol/L 4.5  4.4  4.1   Chloride 96 - 106 mmol/L 100  102  103   CO2 20 - 29 mmol/L _1 Calcium 8.7 - 10.3 mg/dL 9.2  8.8  9.0   Total Protein 6.0 - 8.5  g/dL 6.7  6.0    Total Bilirubin 0.0 - 1.2 mg/dL 1.0  0.9    Alkaline Phos 44 - 121 IU/L 66  67    AST 0 - 40 IU/L 31  26    ALT 0 - 32 IU/L 23  18       Radiology Studies: No results found.     Carmell Austria, MD 02/15/2022, 2:27 PM  Cc: Greig Right, MD

## 2022-02-21 ENCOUNTER — Ambulatory Visit (HOSPITAL_COMMUNITY)
Admission: RE | Admit: 2022-02-21 | Discharge: 2022-02-21 | Disposition: A | Discharge status: Home / Self Care | Payer: PPO | Source: Ambulatory Visit | Attending: Gastroenterology | Admitting: Gastroenterology

## 2022-02-21 DIAGNOSIS — K449 Diaphragmatic hernia without obstruction or gangrene: Secondary | ICD-10-CM | POA: Diagnosis not present

## 2022-02-21 DIAGNOSIS — R1084 Generalized abdominal pain: Secondary | ICD-10-CM | POA: Diagnosis not present

## 2022-02-21 DIAGNOSIS — R1013 Epigastric pain: Secondary | ICD-10-CM | POA: Diagnosis not present

## 2022-02-21 DIAGNOSIS — K219 Gastro-esophageal reflux disease without esophagitis: Secondary | ICD-10-CM | POA: Insufficient documentation

## 2022-02-21 DIAGNOSIS — R140 Abdominal distension (gaseous): Secondary | ICD-10-CM | POA: Insufficient documentation

## 2022-02-21 DIAGNOSIS — J9811 Atelectasis: Secondary | ICD-10-CM | POA: Diagnosis not present

## 2022-02-21 MED ORDER — IOHEXOL 350 MG/ML SOLN
100.0000 mL | Freq: Once | INTRAVENOUS | Status: AC | PRN
  Administered 2022-02-21: 100 mL via INTRAVENOUS

## 2022-03-01 ENCOUNTER — Telehealth: Payer: Self-pay | Admitting: Gastroenterology

## 2022-03-01 NOTE — Telephone Encounter (Signed)
Patient returning your call.

## 2022-03-01 NOTE — Telephone Encounter (Signed)
Spoke with pt:  Documented under result notes:  Pt verbalized understanding with all questions answered.   

## 2022-03-04 DIAGNOSIS — J4540 Moderate persistent asthma, uncomplicated: Secondary | ICD-10-CM | POA: Diagnosis not present

## 2022-03-04 DIAGNOSIS — F17210 Nicotine dependence, cigarettes, uncomplicated: Secondary | ICD-10-CM | POA: Diagnosis not present

## 2022-03-04 DIAGNOSIS — R0600 Dyspnea, unspecified: Secondary | ICD-10-CM | POA: Diagnosis not present

## 2022-03-04 DIAGNOSIS — J479 Bronchiectasis, uncomplicated: Secondary | ICD-10-CM | POA: Diagnosis not present

## 2022-03-04 DIAGNOSIS — R5383 Other fatigue: Secondary | ICD-10-CM | POA: Diagnosis not present

## 2022-03-04 DIAGNOSIS — J310 Chronic rhinitis: Secondary | ICD-10-CM | POA: Diagnosis not present

## 2022-03-16 ENCOUNTER — Ambulatory Visit: Payer: PPO | Admitting: Cardiology

## 2022-04-04 DIAGNOSIS — D492 Neoplasm of unspecified behavior of bone, soft tissue, and skin: Secondary | ICD-10-CM | POA: Diagnosis not present

## 2022-04-06 DIAGNOSIS — J324 Chronic pansinusitis: Secondary | ICD-10-CM | POA: Diagnosis not present

## 2022-04-06 DIAGNOSIS — R0602 Shortness of breath: Secondary | ICD-10-CM | POA: Diagnosis not present

## 2022-04-06 DIAGNOSIS — S99922A Unspecified injury of left foot, initial encounter: Secondary | ICD-10-CM | POA: Diagnosis not present

## 2022-04-20 DIAGNOSIS — Z1231 Encounter for screening mammogram for malignant neoplasm of breast: Secondary | ICD-10-CM | POA: Diagnosis not present

## 2022-04-28 ENCOUNTER — Ambulatory Visit: Payer: PPO | Attending: Cardiology

## 2022-04-28 ENCOUNTER — Ambulatory Visit: Payer: PPO | Attending: Cardiology | Admitting: Cardiology

## 2022-04-28 ENCOUNTER — Encounter: Payer: Self-pay | Admitting: Cardiology

## 2022-04-28 VITALS — BP 122/86 | HR 60 | Ht 66.0 in | Wt 179.2 lb

## 2022-04-28 DIAGNOSIS — F17210 Nicotine dependence, cigarettes, uncomplicated: Secondary | ICD-10-CM

## 2022-04-28 DIAGNOSIS — I2510 Atherosclerotic heart disease of native coronary artery without angina pectoris: Secondary | ICD-10-CM | POA: Diagnosis not present

## 2022-04-28 DIAGNOSIS — R002 Palpitations: Secondary | ICD-10-CM

## 2022-04-28 DIAGNOSIS — E782 Mixed hyperlipidemia: Secondary | ICD-10-CM | POA: Diagnosis not present

## 2022-04-28 NOTE — Progress Notes (Signed)
Cardiology Office Note:    Date:  04/28/2022   ID:  Laurie Barr, DOB February 07, 1951, MRN [id number]  PCP:  Greig Right, MD  Cardiologist:  Jenean Lindau, MD   Referring MD: Greig Right, MD    ASSESSMENT:    1. Coronary artery disease involving native coronary artery of native heart without angina pectoris   2. Cigarette smoker   3. Mixed dyslipidemia    PLAN:    In order of problems listed above:  Coronary artery disease: Secondary prevention stressed with the patient.  Importance of compliance with diet medication stressed and she vocalized understanding. Mixed dyslipidemia: On lipid-lowering medications followed by primary care.  Lipids were reviewed Syncope: Unclear etiology.  Will do a 2-week monitor to assess this.  She also gives some occasional palpitation history and this will help [place] monitor her.  Fall precautions were advised.  She was advised against driving in view of syncopal episodes. Cigarette smoker: I counseled her against smoking and the risks were explained.  I am not sure whether she is able to quit. Patient will be seen in follow-up appointment in 6 months or earlier if the patient has any concerns on echo there was a concern of dilated ascending aorta but the CT scans have been unremarkable and I discussed this with her at length.    Medication Adjustments/Labs and Tests Ordered: Current medicines are reviewed at length with the patient today.  Concerns regarding medicines are outlined above.  No orders of the defined types were placed in this encounter.  No orders of the defined types were placed in this encounter.    No chief complaint on file.    History of Present Illness:    Laurie Barr is a 72 y.o. female.  Patient has past medical history of coronary artery disease, mixed dyslipidemia and unfortunately continues to smoke.  She denies any chest pain orthopnea or PND.  At the time of my evaluation, the patient is alert awake  oriented and in no distress.  She has had 2 spells of passing out spells early in January.  She went to the emergency room for that and was evaluated.  She denies any chest pain orthopnea or PND.  She leads a sedentary lifestyle because of orthopedic issues.  At the time of my evaluation, the patient is alert awake oriented and in no distress.  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   Anxiety    Asthma    Bronchiectasis ([place]) 10/01/2015   CAD (coronary artery disease) 12/19/2019   Chest tightness 08/12/2019   Cigarette smoker 10/20/2020   COPD (chronic obstructive pulmonary disease) ([place]) 10/01/2015   Dyspnea on exertion 08/12/2019   Endometriosis    Ex-smoker 08/12/2019   GERD (gastroesophageal reflux disease)    History of colonic polyps    Hypercholesterolemia    Hypertrophy of bone, left ankle and foot 05/05/2016   IBS (irritable bowel syndrome)    Mixed dyslipidemia 08/12/2019   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  Mild sigmoid diverticulosis   CYSTOSCOPY  11/17/2016   ELBOW SURGERY Left    removed bone due to fall/ Dr. Lorin Mercy   ESOPHAGOGASTRODUODENOSCOPY  11/19/2015   Mild gastritis. Transient hiatal hernia. Otherwise, normal EGD   HAND SURGERY Right    removed cyst   LAPAROTOMY     multiple due to endometriosis fibrous tumors and adhesions   STOMACH SURGERY     Tumor removed size of grapefruit    Current Medications: Current Meds  Medication Sig   albuterol (PROVENTIL HFA;VENTOLIN HFA) 108 (90 Base) MCG/ACT  inhaler Inhale 2 puffs into the lungs every 6 (six) hours as needed for wheezing or shortness of breath.   aspirin 81 MG tablet Take 81 mg by mouth daily.   baclofen (LIORESAL) 10 MG tablet Take 10 mg by mouth 2 (two) times daily as needed for muscle spasms.   citalopram (CELEXA) 20 MG tablet Take 20 mg by mouth daily.   dicyclomine (BENTYL) 10 MG capsule Take 1 capsule (10 mg total) by mouth 2 (two) times daily.   EPINEPHrine 0.3 mg/0.3 mL IJ SOAJ injection Inject 0.3 mg into the muscle as needed for anaphylaxis.   fluticasone (FLONASE) 50 MCG/ACT nasal spray Place 2 sprays into both nostrils daily.   Fluticasone-Umeclidin-Vilant (TRELEGY ELLIPTA) 100-62.5-25 MCG/ACT AEPB Inhale 1 puff into the lungs daily.   montelukast (SINGULAIR) 10 MG tablet Take 10 mg by mouth daily.   nitroGLYCERIN (NITROSTAT) 0.4 MG SL tablet Place 0.4 mg under the tongue every 5 (five) minutes as needed for chest pain.   pantoprazole (PROTONIX) 40 MG tablet Take 1 tablet (40 mg total) by mouth daily.   Probiotic Product (PROBIOTIC PO) Take 1 tablet by mouth daily.   rosuvastatin (CRESTOR) 40 MG tablet Take 40 mg by mouth daily.     Allergies:   Patient has no known allergies.   Social History   Socioeconomic History   Marital status: Unknown    Spouse name: Not on file   Number of children: Not on file   Years of education: Not on file   Highest education level: Not on file  Occupational History   Not on file  Tobacco Use   Smoking status: Former    Types: Cigarettes    Quit date: 08/12/2015    Years since quitting: 6.7   Smokeless tobacco: Never  Vaping Use   Vaping Use: Never used  Substance and Sexual Activity   Alcohol use: Not Currently   Drug use: Never   Sexual activity: Not Currently  Other Topics Concern   Not on file  Social History Narrative   Not on file   Social Determinants of Health   Financial Resource Strain: Not on file  Food Insecurity: Not on file  Transportation Needs: Not  on file  Physical Activity: Not on file  Stress: Not on file  Social Connections: Not on file     Family History: The patient's family history includes Cancer in her father; Diabetes in her brother and sister; Hypertension in her brother, father, mother, and sister. There is no history of Colon cancer, Esophageal cancer, or Heart disease.  ROS:   Please see the history of present illness.    All other systems reviewed and are negative.  EKGs/Labs/Other Studies Reviewed:    The following studies were reviewed today: EKG reveals sinus rhythm with intraventricular conduction delay.   Recent Labs: 06/03/2021: TSH 2.330 02/15/2022: ALT 18; BUN 8; Creatinine, Ser 0.76; Hemoglobin 14.3; Platelets 218.0; Potassium 4.0;  Sodium 135  Recent Lipid Panel    Component Value Date/Time   CHOL 160 06/03/2021 1108   TRIG 186 (H) 06/03/2021 1108   HDL 68 06/03/2021 1108   CHOLHDL 2.4 06/03/2021 1108   LDLCALC 62 06/03/2021 1108    Physical Exam:    VS:  BP 122/86   Pulse 60   Ht '5\' 6"'$  (1.676 m)   Wt 179 lb 3.2 oz (81.3 kg)   SpO2 93%   BMI 28.92 kg/m     Wt Readings from Last 3 Encounters:  04/28/22 179 lb 3.2 oz (81.3 kg)  02/15/22 177 lb (80.3 kg)  09/27/21 172 lb 12.8 oz (78.4 kg)     GEN: Patient is in no acute distress HEENT: Normal NECK: No JVD; No carotid bruits LYMPHATICS: No lymphadenopathy CARDIAC: Hear sounds regular, 2/6 systolic murmur at the apex. RESPIRATORY:  Clear to auscultation without rales, wheezing or rhonchi  ABDOMEN: Soft, non-tender, non-distended MUSCULOSKELETAL:  No edema; No deformity  SKIN: Warm and dry NEUROLOGIC:  Alert and oriented x 3 PSYCHIATRIC:  Normal affect   Signed, Jenean Lindau, MD  04/28/2022 10:36 AM    [facility]

## 2022-04-28 NOTE — Patient Instructions (Signed)
Medication Instructions:  Your physician recommends that you continue on your current medications as directed. Please refer to the Current Medication list given to you today.  *If you need a refill on your cardiac medications before your next appointment, please call your pharmacy*   Lab Work: NONE If you have labs (blood work) drawn today and your tests are completely normal, you will receive your results only by: [place] (if you have MyChart) OR A paper copy in the mail If you have any lab test that is abnormal or we need to change your treatment, we will call you to review the results.   Testing/Procedures: You have been asked to wear a Zio Heart Monitor today. It is to be worn for 14 days. Please remove the monitor on Feb. 15th and mail back in the box provided.  If you have any questions about the monitor please call the company at [phone]     Follow-Up: At [facility], you and your health needs are our priority.  As part of our continuing mission to provide you with exceptional heart care, we have created designated Provider Care Teams.  These Care Teams include your primary Cardiologist (physician) and Advanced Practice Providers (APPs -  Physician Assistants and Nurse Practitioners) who all work together to provide you with the care you need, when you need it.  We recommend signing up for the patient portal called "MyChart".  Sign up information is provided on this After Visit Summary.  MyChart is used to connect with patients for Virtual Visits (Telemedicine).  Patients are able to view lab/test results, encounter notes, upcoming appointments, etc.  Non-urgent messages can be sent to your provider as well.   To learn more about what you can do with MyChart, go to [email or web address].    Your next appointment:   9 month(s)  Provider:   Jyl Heinz, MD    Other Instructions

## 2022-05-13 DIAGNOSIS — J479 Bronchiectasis, uncomplicated: Secondary | ICD-10-CM | POA: Diagnosis not present

## 2022-05-16 DIAGNOSIS — R002 Palpitations: Secondary | ICD-10-CM | POA: Diagnosis not present

## 2022-05-19 ENCOUNTER — Telehealth: Payer: Self-pay

## 2022-05-19 NOTE — Telephone Encounter (Signed)
-----   Message from Jenean Lindau, MD sent at 05/18/2022 12:11 PM EST ----- The results of the study is unremarkable. Please inform patient. I will discuss in detail at next appointment. Cc  primary care/referring physician Jenean Lindau, MD 05/18/2022 12:11 PM

## 2022-05-23 DIAGNOSIS — J4540 Moderate persistent asthma, uncomplicated: Secondary | ICD-10-CM | POA: Diagnosis not present

## 2022-05-23 DIAGNOSIS — R0600 Dyspnea, unspecified: Secondary | ICD-10-CM | POA: Diagnosis not present

## 2022-05-23 DIAGNOSIS — F17210 Nicotine dependence, cigarettes, uncomplicated: Secondary | ICD-10-CM | POA: Diagnosis not present

## 2022-05-23 DIAGNOSIS — R5383 Other fatigue: Secondary | ICD-10-CM | POA: Diagnosis not present

## 2022-05-23 DIAGNOSIS — J479 Bronchiectasis, uncomplicated: Secondary | ICD-10-CM | POA: Diagnosis not present

## 2022-05-23 DIAGNOSIS — J310 Chronic rhinitis: Secondary | ICD-10-CM | POA: Diagnosis not present

## 2022-05-27 DIAGNOSIS — N390 Urinary tract infection, site not specified: Secondary | ICD-10-CM | POA: Diagnosis not present

## 2022-06-03 DIAGNOSIS — H26491 Other secondary cataract, right eye: Secondary | ICD-10-CM | POA: Diagnosis not present

## 2022-06-16 DIAGNOSIS — N3000 Acute cystitis without hematuria: Secondary | ICD-10-CM | POA: Diagnosis not present

## 2022-06-16 DIAGNOSIS — F411 Generalized anxiety disorder: Secondary | ICD-10-CM | POA: Diagnosis not present

## 2022-06-16 DIAGNOSIS — Z6829 Body mass index (BMI) 29.0-29.9, adult: Secondary | ICD-10-CM | POA: Diagnosis not present

## 2022-06-16 DIAGNOSIS — J479 Bronchiectasis, uncomplicated: Secondary | ICD-10-CM | POA: Diagnosis not present

## 2022-06-16 DIAGNOSIS — K219 Gastro-esophageal reflux disease without esophagitis: Secondary | ICD-10-CM | POA: Diagnosis not present

## 2022-06-16 DIAGNOSIS — R030 Elevated blood-pressure reading, without diagnosis of hypertension: Secondary | ICD-10-CM | POA: Diagnosis not present

## 2022-06-16 DIAGNOSIS — E7800 Pure hypercholesterolemia, unspecified: Secondary | ICD-10-CM | POA: Diagnosis not present

## 2022-06-16 DIAGNOSIS — E663 Overweight: Secondary | ICD-10-CM | POA: Diagnosis not present

## 2022-07-25 DIAGNOSIS — Z23 Encounter for immunization: Secondary | ICD-10-CM | POA: Diagnosis not present

## 2022-07-25 DIAGNOSIS — S80812A Abrasion, left lower leg, initial encounter: Secondary | ICD-10-CM | POA: Diagnosis not present

## 2022-07-28 DIAGNOSIS — L03116 Cellulitis of left lower limb: Secondary | ICD-10-CM | POA: Diagnosis not present

## 2022-08-04 DIAGNOSIS — L03116 Cellulitis of left lower limb: Secondary | ICD-10-CM | POA: Diagnosis not present

## 2022-08-04 DIAGNOSIS — S80812A Abrasion, left lower leg, initial encounter: Secondary | ICD-10-CM | POA: Diagnosis not present

## 2022-08-15 DIAGNOSIS — R5383 Other fatigue: Secondary | ICD-10-CM | POA: Diagnosis not present

## 2022-08-15 DIAGNOSIS — J310 Chronic rhinitis: Secondary | ICD-10-CM | POA: Diagnosis not present

## 2022-08-15 DIAGNOSIS — R0600 Dyspnea, unspecified: Secondary | ICD-10-CM | POA: Diagnosis not present

## 2022-08-15 DIAGNOSIS — J4540 Moderate persistent asthma, uncomplicated: Secondary | ICD-10-CM | POA: Diagnosis not present

## 2022-08-15 DIAGNOSIS — F17210 Nicotine dependence, cigarettes, uncomplicated: Secondary | ICD-10-CM | POA: Diagnosis not present

## 2022-08-16 DIAGNOSIS — S80812D Abrasion, left lower leg, subsequent encounter: Secondary | ICD-10-CM | POA: Diagnosis not present

## 2022-08-20 DIAGNOSIS — L02414 Cutaneous abscess of left upper limb: Secondary | ICD-10-CM | POA: Diagnosis not present

## 2022-08-26 DIAGNOSIS — S81812A Laceration without foreign body, left lower leg, initial encounter: Secondary | ICD-10-CM | POA: Diagnosis not present

## 2022-08-26 DIAGNOSIS — S80812A Abrasion, left lower leg, initial encounter: Secondary | ICD-10-CM | POA: Diagnosis not present

## 2022-08-26 DIAGNOSIS — L03116 Cellulitis of left lower limb: Secondary | ICD-10-CM | POA: Diagnosis not present

## 2022-09-20 DIAGNOSIS — J069 Acute upper respiratory infection, unspecified: Secondary | ICD-10-CM | POA: Diagnosis not present

## 2022-09-27 DIAGNOSIS — N952 Postmenopausal atrophic vaginitis: Secondary | ICD-10-CM | POA: Diagnosis not present

## 2022-09-27 DIAGNOSIS — N390 Urinary tract infection, site not specified: Secondary | ICD-10-CM | POA: Diagnosis not present

## 2022-09-27 DIAGNOSIS — R339 Retention of urine, unspecified: Secondary | ICD-10-CM | POA: Diagnosis not present

## 2022-10-04 DIAGNOSIS — R519 Headache, unspecified: Secondary | ICD-10-CM | POA: Diagnosis not present

## 2022-10-18 DIAGNOSIS — R0981 Nasal congestion: Secondary | ICD-10-CM | POA: Diagnosis not present

## 2022-10-18 DIAGNOSIS — Z7951 Long term (current) use of inhaled steroids: Secondary | ICD-10-CM | POA: Diagnosis not present

## 2022-10-18 DIAGNOSIS — J342 Deviated nasal septum: Secondary | ICD-10-CM | POA: Diagnosis not present

## 2022-11-10 DIAGNOSIS — R10817 Generalized abdominal tenderness: Secondary | ICD-10-CM | POA: Diagnosis not present

## 2022-11-10 DIAGNOSIS — N3000 Acute cystitis without hematuria: Secondary | ICD-10-CM | POA: Diagnosis not present

## 2022-11-11 DIAGNOSIS — N3000 Acute cystitis without hematuria: Secondary | ICD-10-CM | POA: Diagnosis not present

## 2022-11-23 DIAGNOSIS — J479 Bronchiectasis, uncomplicated: Secondary | ICD-10-CM | POA: Diagnosis not present

## 2022-11-23 DIAGNOSIS — J4540 Moderate persistent asthma, uncomplicated: Secondary | ICD-10-CM | POA: Diagnosis not present

## 2022-11-23 DIAGNOSIS — R5383 Other fatigue: Secondary | ICD-10-CM | POA: Diagnosis not present

## 2022-11-23 DIAGNOSIS — J310 Chronic rhinitis: Secondary | ICD-10-CM | POA: Diagnosis not present

## 2022-11-23 DIAGNOSIS — J301 Allergic rhinitis due to pollen: Secondary | ICD-10-CM | POA: Diagnosis not present

## 2022-11-23 DIAGNOSIS — R0600 Dyspnea, unspecified: Secondary | ICD-10-CM | POA: Diagnosis not present

## 2022-11-23 DIAGNOSIS — F17210 Nicotine dependence, cigarettes, uncomplicated: Secondary | ICD-10-CM | POA: Diagnosis not present

## 2022-12-09 DIAGNOSIS — S81812A Laceration without foreign body, left lower leg, initial encounter: Secondary | ICD-10-CM | POA: Diagnosis not present

## 2022-12-09 DIAGNOSIS — S8012XA Contusion of left lower leg, initial encounter: Secondary | ICD-10-CM | POA: Diagnosis not present

## 2022-12-19 DIAGNOSIS — J479 Bronchiectasis, uncomplicated: Secondary | ICD-10-CM | POA: Diagnosis not present

## 2022-12-19 DIAGNOSIS — R7301 Impaired fasting glucose: Secondary | ICD-10-CM | POA: Diagnosis not present

## 2022-12-19 DIAGNOSIS — F17200 Nicotine dependence, unspecified, uncomplicated: Secondary | ICD-10-CM | POA: Diagnosis not present

## 2022-12-19 DIAGNOSIS — Z6829 Body mass index (BMI) 29.0-29.9, adult: Secondary | ICD-10-CM | POA: Diagnosis not present

## 2022-12-19 DIAGNOSIS — F411 Generalized anxiety disorder: Secondary | ICD-10-CM | POA: Diagnosis not present

## 2022-12-19 DIAGNOSIS — Z2821 Immunization not carried out because of patient refusal: Secondary | ICD-10-CM | POA: Diagnosis not present

## 2022-12-19 DIAGNOSIS — E7800 Pure hypercholesterolemia, unspecified: Secondary | ICD-10-CM | POA: Diagnosis not present

## 2022-12-19 DIAGNOSIS — K219 Gastro-esophageal reflux disease without esophagitis: Secondary | ICD-10-CM | POA: Diagnosis not present

## 2022-12-19 DIAGNOSIS — I700 Atherosclerosis of aorta: Secondary | ICD-10-CM | POA: Diagnosis not present

## 2022-12-19 DIAGNOSIS — R7303 Prediabetes: Secondary | ICD-10-CM | POA: Diagnosis not present

## 2022-12-19 DIAGNOSIS — E663 Overweight: Secondary | ICD-10-CM | POA: Diagnosis not present

## 2022-12-19 DIAGNOSIS — I2584 Coronary atherosclerosis due to calcified coronary lesion: Secondary | ICD-10-CM | POA: Diagnosis not present

## 2022-12-20 LAB — LAB REPORT - SCANNED
A1c: 6.2
EGFR: 93

## 2023-01-24 ENCOUNTER — Ambulatory Visit: Payer: PPO | Attending: Cardiology | Admitting: Cardiology

## 2023-01-24 ENCOUNTER — Encounter: Payer: Self-pay | Admitting: Cardiology

## 2023-01-24 VITALS — BP 126/84 | HR 68 | Ht 65.0 in | Wt 175.0 lb

## 2023-01-24 DIAGNOSIS — J431 Panlobular emphysema: Secondary | ICD-10-CM | POA: Diagnosis not present

## 2023-01-24 DIAGNOSIS — I2510 Atherosclerotic heart disease of native coronary artery without angina pectoris: Secondary | ICD-10-CM

## 2023-01-24 DIAGNOSIS — E782 Mixed hyperlipidemia: Secondary | ICD-10-CM | POA: Diagnosis not present

## 2023-01-24 DIAGNOSIS — F17210 Nicotine dependence, cigarettes, uncomplicated: Secondary | ICD-10-CM

## 2023-01-24 MED ORDER — NITROGLYCERIN 0.4 MG SL SUBL: 0.4000 mg | SUBLINGUAL_TABLET | SUBLINGUAL | 11 refills | Status: AC | PRN

## 2023-01-24 NOTE — Progress Notes (Signed)
Cardiology Office Note:    Date:  01/24/2023   ID:  Laurie Barr, DOB November 20, 1950, MRN [id number]  PCP:  Alinda Deem, MD  Cardiologist:  Garwin Brothers, MD   Referring MD: Alinda Deem, MD    ASSESSMENT:    1. Coronary artery disease involving native coronary artery of native heart without angina pectoris   2. Mixed dyslipidemia   3. Panlobular emphysema (HCC)   4. Cigarette smoker    PLAN:    In order of problems listed above:  Coronary artery disease: Secondary prevention stressed to the patient.  Importance of compliance with diet medication stressed and she vocalized understanding.  She was advised to walk at least half an hour a day 5 days a week and she promises to do so. Cigarette smoker: I spent 5 minutes with the patient discussing solely about smoking. Smoking cessation was counseled. I suggested to the patient also different medications and pharmacological interventions. Patient is keen to try stopping on its own at this time. He will get back to me if he needs any further assistance in this matter. Essential hypertension: Blood pressure stable and diet was emphasized.  Lifestyle modification urged. Mixed dyslipidemia: On lipid-lowering medications. Has not had blood work recently.  She will come back for a Chem-7 liver lipid check in the next few days.  Diet stressed.  Goal LDL less than 60. Patient will be seen in follow-up appointment in 6 months or earlier if the patient has any concerns.    Medication Adjustments/Labs and Tests Ordered: Current medicines are reviewed at length with the patient today.  Concerns regarding medicines are outlined above.  No orders of the defined types were placed in this encounter.  No orders of the defined types were placed in this encounter.    No chief complaint on file.    History of Present Illness:    Laurie Barr is a 72 y.o. female.  Patient has past medical history of coronary artery disease, COPD,  active smoker unfortunately continues to smoke heavily, essential hypertension and mixed dyslipidemia.  She leads a sedentary lifestyle.  She denies any chest pain orthopnea or PND.  At the time of my evaluation, the patient is alert awake oriented and in no distress.  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   Anxiety    Asthma    Bronchiectasis (HCC) 10/01/2015   CAD (coronary artery disease) 12/19/2019   Chest tightness 08/12/2019   Cigarette smoker 10/20/2020   COPD (chronic obstructive pulmonary disease) (HCC) 10/01/2015   Dyspnea on exertion 08/12/2019   Endometriosis    Ex-smoker 08/12/2019   GERD (gastroesophageal reflux disease)    History of colonic polyps    Hypercholesterolemia    Hypertrophy of bone, left ankle and foot 05/05/2016   IBS (irritable bowel syndrome)    Mixed dyslipidemia 08/12/2019   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 Mild sigmoid diverticulosis   CYSTOSCOPY  11/17/2016   ELBOW SURGERY Left    removed bone due  to fall/ Dr. Ophelia Charter   ESOPHAGOGASTRODUODENOSCOPY  11/19/2015   Mild gastritis. Transient hiatal hernia. Otherwise, normal EGD   HAND SURGERY Right    removed cyst   LAPAROTOMY     multiple due to endometriosis fibrous tumors and adhesions   STOMACH SURGERY     Tumor removed size of grapefruit    Current Medications: Current Meds  Medication Sig   albuterol (PROVENTIL HFA;VENTOLIN HFA) 108 (90 Base) MCG/ACT inhaler Inhale 2 puffs into the lungs every 6 (six) hours as needed for  wheezing or shortness of breath.   aspirin 81 MG tablet Take 81 mg by mouth daily.   baclofen (LIORESAL) 10 MG tablet Take 10 mg by mouth 2 (two) times daily as needed for muscle spasms.   citalopram (CELEXA) 20 MG tablet Take 20 mg by mouth daily.   dicyclomine (BENTYL) 10 MG capsule Take 1 capsule (10 mg total) by mouth 2 (two) times daily.   EPINEPHrine 0.3 mg/0.3 mL IJ SOAJ injection Inject 0.3 mg into the muscle as needed for anaphylaxis.   fluticasone (FLONASE) 50 MCG/ACT nasal spray Place 2 sprays into both nostrils daily.   Fluticasone-Umeclidin-Vilant (TRELEGY ELLIPTA) 100-62.5-25 MCG/ACT AEPB Inhale 1 puff into the lungs daily.   montelukast (SINGULAIR) 10 MG tablet Take 10 mg by mouth daily.   mupirocin ointment (BACTROBAN) 2 % Apply topically as needed (rash).   nitroGLYCERIN (NITROSTAT) 0.4 MG SL tablet Place 0.4 mg under the tongue every 5 (five) minutes as needed for chest pain.   pantoprazole (PROTONIX) 40 MG tablet Take 1 tablet (40 mg total) by mouth daily.   Probiotic Product (PROBIOTIC PO) Take 1 tablet by mouth daily.   rosuvastatin (CRESTOR) 40 MG tablet Take 40 mg by mouth daily.     Allergies:   Patient has no known allergies.   Social History   Socioeconomic History   Marital status: Unknown    Spouse name: Not on file   Number of children: Not on file   Years of education: Not on file   Highest education level: Not on file  Occupational History   Not on file  Tobacco Use   Smoking status: Former    Current packs/day: 0.00    Types: Cigarettes    Quit date: 08/12/2015    Years since quitting: 7.4   Smokeless tobacco: Never  Vaping Use   Vaping status: Never Used  Substance and Sexual Activity   Alcohol use: Not Currently   Drug use: Never   Sexual activity: Not Currently  Other Topics Concern   Not on file  Social History Narrative   Not on file   Social Determinants of Health   Financial Resource Strain: Not on file  Food Insecurity: Not on  file  Transportation Needs: Not on file  Physical Activity: Not on file  Stress: Not on file  Social Connections: Not on file     Family History: The patient's family history includes Cancer in her father; Diabetes in her brother and sister; Hypertension in her brother, father, mother, and sister. There is no history of Colon cancer, Esophageal cancer, or Heart disease.  ROS:   Please see the history of present illness.    All other systems reviewed and are negative.  EKGs/Labs/Other Studies Reviewed:    The following studies were reviewed today: .Marland Kitchen   I discussed my findings with the patient at length.   Recent Labs: 02/15/2022: ALT 18; BUN 8; Creatinine, Ser 0.76; Hemoglobin 14.3; Platelets 218.0; Potassium 4.0;  Sodium 135  Recent Lipid Panel    Component Value Date/Time   CHOL 160 06/03/2021 1108   TRIG 186 (H) 06/03/2021 1108   HDL 68 06/03/2021 1108   CHOLHDL 2.4 06/03/2021 1108   LDLCALC 62 06/03/2021 1108    Physical Exam:    VS:  BP 126/84   Pulse 68   Ht 5\' 5"  (1.651 m)   Wt 175 lb (79.4 kg)   SpO2 96%   BMI 29.12 kg/m     Wt Readings from Last 3 Encounters:  01/24/23 175 lb (79.4 kg)  04/28/22 179 lb 3.2 oz (81.3 kg)  02/15/22 177 lb (80.3 kg)     GEN: Patient is in no acute distress HEENT: Normal NECK: No JVD; No carotid bruits LYMPHATICS: No lymphadenopathy CARDIAC: Hear sounds regular, 2/6 systolic murmur at the apex. RESPIRATORY:  Clear to auscultation without rales, wheezing or rhonchi  ABDOMEN: Soft, non-tender, non-distended MUSCULOSKELETAL:  No edema; No deformity  SKIN: Warm and dry NEUROLOGIC:  Alert and oriented x 3 PSYCHIATRIC:  Normal affect   Signed, Garwin Brothers, MD  01/24/2023 1:24 PM    [facility]

## 2023-01-24 NOTE — Patient Instructions (Addendum)
Medication Instructions:  Your physician recommends that you continue on your current medications as directed. Please refer to the Current Medication list given to you today.  *If you need a refill on your cardiac medications before your next appointment, please call your pharmacy*   Lab Work: Your physician recommends that you return for lab work in: when fasting You need to have labs done when you are fasting.  You can come Monday through Friday 8:30 am to 12:00 pm and 1:15 to 4:30. You do not need to make an appointment as the order has already been placed. The labs you are going to have done are BMET,  LFT and Lipids.    Testing/Procedures: None Ordered   Follow-Up: At [facility], you and your health needs are our priority.  As part of our continuing mission to provide you with exceptional heart care, we have created designated Provider Care Teams.  These Care Teams include your primary Cardiologist (physician) and Advanced Practice Providers (APPs -  Physician Assistants and Nurse Practitioners) who all work together to provide you with the care you need, when you need it.  We recommend signing up for the patient portal called "MyChart".  Sign up information is provided on this After Visit Summary.  MyChart is used to connect with patients for Virtual Visits (Telemedicine).  Patients are able to view lab/test results, encounter notes, upcoming appointments, etc.  Non-urgent messages can be sent to your provider as well.   To learn more about what you can do with MyChart, go to [email or web address].    Your next appointment:   9 month(s)  The format for your next appointment:   In Person  Provider:   Belva Crome, MD    Other Instructions NA

## 2023-02-16 DIAGNOSIS — E663 Overweight: Secondary | ICD-10-CM | POA: Diagnosis not present

## 2023-02-16 DIAGNOSIS — Z6829 Body mass index (BMI) 29.0-29.9, adult: Secondary | ICD-10-CM | POA: Diagnosis not present

## 2023-02-16 DIAGNOSIS — N3000 Acute cystitis without hematuria: Secondary | ICD-10-CM | POA: Diagnosis not present

## 2023-02-16 DIAGNOSIS — Z1389 Encounter for screening for other disorder: Secondary | ICD-10-CM | POA: Diagnosis not present

## 2023-02-16 DIAGNOSIS — F17200 Nicotine dependence, unspecified, uncomplicated: Secondary | ICD-10-CM | POA: Diagnosis not present

## 2023-02-16 DIAGNOSIS — Z0000 Encounter for general adult medical examination without abnormal findings: Secondary | ICD-10-CM | POA: Diagnosis not present

## 2023-02-16 DIAGNOSIS — M810 Age-related osteoporosis without current pathological fracture: Secondary | ICD-10-CM | POA: Diagnosis not present

## 2023-02-16 DIAGNOSIS — Z2821 Immunization not carried out because of patient refusal: Secondary | ICD-10-CM | POA: Diagnosis not present

## 2023-02-16 DIAGNOSIS — R102 Pelvic and perineal pain: Secondary | ICD-10-CM | POA: Diagnosis not present

## 2023-02-21 DIAGNOSIS — J4540 Moderate persistent asthma, uncomplicated: Secondary | ICD-10-CM | POA: Diagnosis not present

## 2023-02-21 DIAGNOSIS — F17210 Nicotine dependence, cigarettes, uncomplicated: Secondary | ICD-10-CM | POA: Diagnosis not present

## 2023-02-21 DIAGNOSIS — J310 Chronic rhinitis: Secondary | ICD-10-CM | POA: Diagnosis not present

## 2023-02-21 DIAGNOSIS — J301 Allergic rhinitis due to pollen: Secondary | ICD-10-CM | POA: Diagnosis not present

## 2023-02-21 DIAGNOSIS — J479 Bronchiectasis, uncomplicated: Secondary | ICD-10-CM | POA: Diagnosis not present

## 2023-03-10 ENCOUNTER — Other Ambulatory Visit: Payer: Self-pay | Admitting: Gastroenterology

## 2023-03-16 DIAGNOSIS — M4316 Spondylolisthesis, lumbar region: Secondary | ICD-10-CM | POA: Diagnosis not present

## 2023-03-16 DIAGNOSIS — M5416 Radiculopathy, lumbar region: Secondary | ICD-10-CM | POA: Diagnosis not present

## 2023-04-11 ENCOUNTER — Other Ambulatory Visit: Payer: Self-pay | Admitting: Gastroenterology

## 2023-04-11 DIAGNOSIS — R030 Elevated blood-pressure reading, without diagnosis of hypertension: Secondary | ICD-10-CM | POA: Diagnosis not present

## 2023-04-11 DIAGNOSIS — R350 Frequency of micturition: Secondary | ICD-10-CM | POA: Diagnosis not present

## 2023-04-11 DIAGNOSIS — N3000 Acute cystitis without hematuria: Secondary | ICD-10-CM | POA: Diagnosis not present

## 2023-05-01 DIAGNOSIS — R911 Solitary pulmonary nodule: Secondary | ICD-10-CM | POA: Diagnosis not present

## 2023-05-01 DIAGNOSIS — Z1231 Encounter for screening mammogram for malignant neoplasm of breast: Secondary | ICD-10-CM | POA: Diagnosis not present

## 2023-05-01 DIAGNOSIS — J479 Bronchiectasis, uncomplicated: Secondary | ICD-10-CM | POA: Diagnosis not present

## 2023-05-02 DIAGNOSIS — Z1231 Encounter for screening mammogram for malignant neoplasm of breast: Secondary | ICD-10-CM | POA: Diagnosis not present

## 2023-05-08 DIAGNOSIS — R7303 Prediabetes: Secondary | ICD-10-CM | POA: Diagnosis not present

## 2023-05-08 DIAGNOSIS — D692 Other nonthrombocytopenic purpura: Secondary | ICD-10-CM | POA: Diagnosis not present

## 2023-05-08 DIAGNOSIS — I700 Atherosclerosis of aorta: Secondary | ICD-10-CM | POA: Diagnosis not present

## 2023-05-08 DIAGNOSIS — K219 Gastro-esophageal reflux disease without esophagitis: Secondary | ICD-10-CM | POA: Diagnosis not present

## 2023-05-08 DIAGNOSIS — N3000 Acute cystitis without hematuria: Secondary | ICD-10-CM | POA: Diagnosis not present

## 2023-05-08 DIAGNOSIS — I2584 Coronary atherosclerosis due to calcified coronary lesion: Secondary | ICD-10-CM | POA: Diagnosis not present

## 2023-05-08 DIAGNOSIS — I10 Essential (primary) hypertension: Secondary | ICD-10-CM | POA: Diagnosis not present

## 2023-05-08 DIAGNOSIS — E7800 Pure hypercholesterolemia, unspecified: Secondary | ICD-10-CM | POA: Diagnosis not present

## 2023-05-08 DIAGNOSIS — J479 Bronchiectasis, uncomplicated: Secondary | ICD-10-CM | POA: Diagnosis not present

## 2023-05-08 DIAGNOSIS — Z2821 Immunization not carried out because of patient refusal: Secondary | ICD-10-CM | POA: Diagnosis not present

## 2023-05-10 LAB — LAB REPORT - SCANNED
A1c: 6.1
Calcium: 8.6
EGFR: 92

## 2023-05-26 DIAGNOSIS — J4540 Moderate persistent asthma, uncomplicated: Secondary | ICD-10-CM | POA: Diagnosis not present

## 2023-05-26 DIAGNOSIS — M542 Cervicalgia: Secondary | ICD-10-CM | POA: Diagnosis not present

## 2023-05-26 DIAGNOSIS — Z79899 Other long term (current) drug therapy: Secondary | ICD-10-CM | POA: Diagnosis not present

## 2023-05-26 DIAGNOSIS — J301 Allergic rhinitis due to pollen: Secondary | ICD-10-CM | POA: Diagnosis not present

## 2023-05-26 DIAGNOSIS — J310 Chronic rhinitis: Secondary | ICD-10-CM | POA: Diagnosis not present

## 2023-05-26 DIAGNOSIS — F17210 Nicotine dependence, cigarettes, uncomplicated: Secondary | ICD-10-CM | POA: Diagnosis not present

## 2023-05-26 DIAGNOSIS — E7800 Pure hypercholesterolemia, unspecified: Secondary | ICD-10-CM | POA: Diagnosis not present

## 2023-05-26 DIAGNOSIS — J479 Bronchiectasis, uncomplicated: Secondary | ICD-10-CM | POA: Diagnosis not present

## 2023-05-26 DIAGNOSIS — K219 Gastro-esophageal reflux disease without esophagitis: Secondary | ICD-10-CM | POA: Diagnosis not present

## 2023-06-21 ENCOUNTER — Other Ambulatory Visit: Payer: Self-pay | Admitting: Gastroenterology

## 2023-06-22 ENCOUNTER — Telehealth: Payer: Self-pay | Admitting: Cardiology

## 2023-06-22 NOTE — Telephone Encounter (Signed)
 Patient c/o Palpitations:  STAT if patient reporting lightheadedness, shortness of breath, or chest pain  How long have you had palpitations/irregular HR/ Afib? Are you having the symptoms now?   No  Are you currently experiencing lightheadedness, SOB or CP?   Lightheaded, SOB (patient has lung issues)  Do you have a history of afib (atrial fibrillation) or irregular heart rhythm?  Yes  Have you checked your BP or HR? (document readings if available):   BP 152/85  HR 62 (while on phone)  Are you experiencing any other symptoms?   Ache in back of her neck  Patient stated she has been having increased rapid heart beats 4-5 times this month.  Patient stated symptoms occur when she is moving around and started 2 months ago.  Patient stated she has taken Nitroglycerin as needed.  Patient noted she has not taken her BP medication today as yet.

## 2023-06-22 NOTE — Telephone Encounter (Signed)
 Spoke with pt who states that her heart rate has been up to 300 at times. Pt has not been to the ED and states that it last less than a minute. Pt requested appointment. Appointment has been made.

## 2023-06-26 DIAGNOSIS — E7800 Pure hypercholesterolemia, unspecified: Secondary | ICD-10-CM | POA: Diagnosis not present

## 2023-06-26 DIAGNOSIS — I10 Essential (primary) hypertension: Secondary | ICD-10-CM | POA: Diagnosis not present

## 2023-06-26 DIAGNOSIS — M542 Cervicalgia: Secondary | ICD-10-CM | POA: Diagnosis not present

## 2023-06-26 DIAGNOSIS — J328 Other chronic sinusitis: Secondary | ICD-10-CM | POA: Diagnosis not present

## 2023-06-26 DIAGNOSIS — Z79899 Other long term (current) drug therapy: Secondary | ICD-10-CM | POA: Diagnosis not present

## 2023-06-26 DIAGNOSIS — K219 Gastro-esophageal reflux disease without esophagitis: Secondary | ICD-10-CM | POA: Diagnosis not present

## 2023-06-27 ENCOUNTER — Other Ambulatory Visit: Payer: Self-pay

## 2023-06-28 ENCOUNTER — Ambulatory Visit: Attending: Cardiology | Admitting: Cardiology

## 2023-06-28 ENCOUNTER — Encounter: Payer: Self-pay | Admitting: Cardiology

## 2023-06-28 ENCOUNTER — Ambulatory Visit: Attending: Cardiology

## 2023-06-28 VITALS — BP 112/74 | HR 66 | Ht 66.0 in | Wt 172.2 lb

## 2023-06-28 DIAGNOSIS — I209 Angina pectoris, unspecified: Secondary | ICD-10-CM | POA: Insufficient documentation

## 2023-06-28 DIAGNOSIS — J431 Panlobular emphysema: Secondary | ICD-10-CM | POA: Diagnosis not present

## 2023-06-28 DIAGNOSIS — F17210 Nicotine dependence, cigarettes, uncomplicated: Secondary | ICD-10-CM | POA: Diagnosis not present

## 2023-06-28 DIAGNOSIS — I2510 Atherosclerotic heart disease of native coronary artery without angina pectoris: Secondary | ICD-10-CM

## 2023-06-28 DIAGNOSIS — I10 Essential (primary) hypertension: Secondary | ICD-10-CM

## 2023-06-28 DIAGNOSIS — I25118 Atherosclerotic heart disease of native coronary artery with other forms of angina pectoris: Secondary | ICD-10-CM

## 2023-06-28 DIAGNOSIS — E782 Mixed hyperlipidemia: Secondary | ICD-10-CM | POA: Diagnosis not present

## 2023-06-28 HISTORY — DX: Essential (primary) hypertension: I10

## 2023-06-28 HISTORY — DX: Angina pectoris, unspecified: I20.9

## 2023-06-28 MED ORDER — IVABRADINE HCL 5 MG PO TABS: 10.0000 mg | ORAL_TABLET | ORAL | 0 refills | Status: DC

## 2023-06-28 MED ORDER — IVABRADINE HCL 5 MG PO TABS: 5.0000 mg | ORAL_TABLET | ORAL | 0 refills | Status: DC

## 2023-06-28 NOTE — Progress Notes (Signed)
 Cardiology Office Note:    Date:  06/28/2023   ID:  Laurie Barr, DOB 04/03/1950, MRN [id number]  PCP:  Alinda Deem, MD  Cardiologist:  Garwin Brothers, MD   Referring MD: Alinda Deem, MD    ASSESSMENT:    1. Mixed dyslipidemia   2. Coronary artery disease involving native coronary artery of native heart without angina pectoris   3. Panlobular emphysema (HCC)   4. Essential hypertension    PLAN:    In order of problems listed above:  Coronary artery disease: Angina pectoris: Patient's symptoms are concerning.  When she has faster heart rates with palpitations she has tightness in the neck.  She has significant nonobstructive coronary artery disease in 2021 and at that time medical therapy was recommended.  She has multiple risk factors and continues to smoke.  She will need evaluation in several modalities invasive and noninvasive were discussed she prefers CT coronary angiography and we will set her up for this. Essential hypertension: Blood pressure is stable and diet was emphasized.  Lifestyle modification urged. Mixed dyslipidemia: On lipid-lowering medications followed by primary care.  Lipids reviewed and discussed with the patient at length and they are at goal. Cigarette smoker: I spent 5 minutes with the patient discussing solely about smoking. Smoking cessation was counseled. I suggested to the patient also different medications and pharmacological interventions. Patient is keen to try stopping on its own at this time. He will get back to me if he needs any further assistance in this matter. She has nitroglycerin tablets and knows its protocol and 911 protocol.  I discussed this with her at length.  She will be seen in follow-up appointment after the coronary angiography.   Medication Adjustments/Labs and Tests Ordered: Current medicines are reviewed at length with the patient today.  Concerns regarding medicines are outlined above.  Orders Placed This Encounter   Procedures   EKG 12-Lead   No orders of the defined types were placed in this encounter.    No chief complaint on file.    History of Present Illness:    Laurie Barr is a 73 y.o. female.  Patient has past medical history of coronary artery disease, essential hypertension, dyslipidemia, palpitations and unfortunately continues to smoke.  She tells me that she has palpitations on and off.  No dizziness or syncope.  When she has palpitations with elevated heart rate she has tightness in the neck.  No orthopnea or PND.  At the time of my evaluation, the patient is alert awake oriented and in no distress.  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   Anxiety    Asthma    Bronchiectasis (HCC) 10/01/2015   CAD (coronary artery disease) 12/19/2019   Chest tightness 08/12/2019   Cigarette smoker 10/20/2020   COPD (chronic obstructive pulmonary disease) (HCC) 10/01/2015   Dyspnea on exertion 08/12/2019   Endometriosis    Ex-smoker 08/12/2019   GERD (gastroesophageal reflux disease)    History of colonic polyps    Hypercholesterolemia    Hypertrophy of bone, left ankle and foot 05/05/2016   IBS (irritable bowel syndrome)    Mixed dyslipidemia 08/12/2019   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 Mild sigmoid diverticulosis   CYSTOSCOPY  11/17/2016   ELBOW SURGERY Left    removed bone due to fall/ Dr. Ophelia Charter   ESOPHAGOGASTRODUODENOSCOPY  11/19/2015   Mild gastritis. Transient hiatal hernia. Otherwise, normal EGD   HAND  SURGERY Right    removed cyst   LAPAROTOMY     multiple due to endometriosis fibrous tumors and adhesions   STOMACH SURGERY     Tumor removed size of grapefruit    Current Medications: Current Meds  Medication Sig   albuterol (PROVENTIL HFA;VENTOLIN HFA) 108 (90 Base) MCG/ACT inhaler Inhale 2 puffs into the lungs every 6 (six) hours as needed for wheezing or shortness of breath.   aspirin 81 MG tablet Take 81 mg by mouth daily.   baclofen (LIORESAL) 10 MG tablet Take 10 mg by mouth 2 (two) times daily as needed for muscle spasms.   citalopram (CELEXA) 20 MG tablet Take 10 mg by mouth daily.   dicyclomine (BENTYL) 10 MG capsule TAKE 1 CAPSULE(10 MG) BY MOUTH TWICE DAILY   EPINEPHrine 0.3 mg/0.3 mL IJ SOAJ injection Inject 0.3 mg into the muscle as needed for anaphylaxis.   fluticasone (FLONASE) 50 MCG/ACT nasal spray Place 2 sprays into both nostrils daily.   Fluticasone-Umeclidin-Vilant (TRELEGY ELLIPTA) 100-62.5-25 MCG/ACT AEPB Inhale 1 puff into the lungs daily.   losartan (COZAAR) 50 MG tablet Take 50 mg by mouth daily.   montelukast (SINGULAIR) 10 MG tablet Take 10 mg by mouth daily.   mupirocin ointment (BACTROBAN) 2 % Apply 1 Application topically as needed (rash).   nitroGLYCERIN (NITROSTAT) 0.4 MG SL tablet Place 1 tablet (0.4 mg total) under the tongue every 5 (five) minutes as needed for chest pain.   pantoprazole (PROTONIX) 40 MG tablet TAKE 1 TABLET(40 MG) BY MOUTH DAILY   Probiotic Product (PROBIOTIC PO) Take 1 tablet by mouth daily.   rosuvastatin (CRESTOR) 40 MG tablet Take 40 mg by mouth daily.     Allergies:   Patient has no known allergies.   Social History   Socioeconomic History   Marital status: Unknown    Spouse name: Not on file   Number of children: Not on file   Years of education: Not on file   Highest education level: Not on file  Occupational History   Not on file  Tobacco Use   Smoking status: Former    Current packs/day: 0.00    Types:  Cigarettes    Quit date: 08/12/2015    Years since quitting: 7.8   Smokeless tobacco: Never  Vaping Use   Vaping status: Never Used  Substance and Sexual Activity   Alcohol use: Not Currently   Drug use: Never   Sexual activity: Not Currently  Other Topics Concern   Not on file  Social History Narrative   Not on file   Social Drivers of Health   Financial Resource Strain: Not on file  Food Insecurity: Not on file  Transportation Needs: Not on file  Physical Activity: Not on file  Stress: Not on file  Social Connections: Not on file     Family History: The patient's family history includes Cancer in her father; Diabetes in her brother and sister; Hypertension in her brother, father, mother, and sister. There is no history of Colon cancer, Esophageal cancer, or Heart disease.  ROS:   Please see the history of present illness.    All other systems reviewed and are negative.  EKGs/Labs/Other Studies Reviewed:    The following studies were reviewed today: .Marland KitchenEKG Interpretation Date/Time:  Wednesday June 28 2023 08:04:28 EDT Ventricular Rate:  66 PR Interval:  170 QRS Duration:  136 QT Interval:  420 QTC Calculation: 440 R Axis:   1  Text Interpretation: Normal sinus rhythm Left bundle branch block Abnormal ECG No previous ECGs available Confirmed by Belva Crome [phone]) on 06/28/2023 8:12:50 AM     Recent Labs: No results found for requested labs within last 365 days.  Recent Lipid Panel    Component Value Date/Time   CHOL 160 06/03/2021 1108   TRIG 186 (H) 06/03/2021 1108   HDL 68 06/03/2021 1108   CHOLHDL 2.4 06/03/2021 1108   LDLCALC 62 06/03/2021 1108    Physical Exam:    VS:  BP 112/74   Pulse 66   Ht 5\' 6"  (1.676 m)   Wt 172 lb 3.2 oz (78.1 kg)   SpO2 96%   BMI 27.79 kg/m     Wt Readings from Last 3 Encounters:  06/28/23 172 lb 3.2 oz (78.1 kg)  01/24/23 175 lb (79.4 kg)  04/28/22 179 lb 3.2 oz (81.3 kg)     GEN: Patient is in no acute  distress HEENT: Normal NECK: No JVD; No carotid bruits LYMPHATICS: No lymphadenopathy CARDIAC: Hear sounds regular, 2/6 systolic murmur at the apex. RESPIRATORY:  Clear to auscultation without rales, wheezing or rhonchi  ABDOMEN: Soft, non-tender, non-distended MUSCULOSKELETAL:  No edema; No deformity  SKIN: Warm and dry NEUROLOGIC:  Alert and oriented x 3 PSYCHIATRIC:  Normal affect   Signed, Garwin Brothers, MD  06/28/2023 8:15 AM    [facility]

## 2023-06-28 NOTE — Patient Instructions (Addendum)
 Medication Instructions:  No changes   *If you need a refill on your cardiac medications before your next appointment, please call your pharmacy*   Lab Work: None ordered  If you have labs (blood work) drawn today and your tests are completely normal, you will receive your results only by: MyChart Message (if you have MyChart) OR A paper copy in the mail If you have any lab test that is abnormal or we need to change your treatment, we will call you to review the results.  A zio monitor was ordered today. It will remain on for 14 days. Remove 07/12/2023. You will then return monitor and event diary in provided box. It takes 1-2 weeks for report to be downloaded and returned to [place]. We will call you with the results. If monitor falls off or has orange flashing light, please call [employer] for further instructions.   Testing/Procedures:   Your cardiac CT will be scheduled at one of the below locations:   [facility]       Please follow these instructions carefully (unless otherwise directed):  Hold all erectile dysfunction medications at least 3 days (72 hrs) prior to test.  On the Night Before the Test: Be sure to Drink plenty of water. Do not consume any caffeinated/decaffeinated beverages or chocolate 12 hours prior to your test. Do not take any antihistamines 12 hours prior to your test. .  On the Day of the Test: Drink plenty of water until 1 hour prior to the test. Do not eat any food 4 hours prior to the test. You may take your regular medications prior to the test.  Take 5 mg of Corlanor  two hours prior to test. HOLD Furosemide/Hydrochlorothiazide morning of the test. FEMALES- please wear underwire-free bra if available, avoid dresses & tight clothing         After the Test: Drink plenty of water. After receiving IV contrast, you may experience a mild flushed feeling. This is normal. On occasion, you may experience a mild rash up to 24 hours after the test.  This is not dangerous. If this occurs, you can take Benadryl 25 mg and increase your fluid intake. If you experience trouble breathing, this can be serious. If it is severe call 911 IMMEDIATELY. If it is mild, please call our office. If you take any of these medications: Glipizide/Metformin, Avandament, Glucavance, please do not take 48 hours after completing test unless otherwise instructed.  We will call to schedule your test 2-4 weeks out understanding that some insurance companies will need an authorization prior to the service being performed.   For non-scheduling related questions, please contact the cardiac imaging nurse navigator should you have any questions/concerns: Rockwell Alexandria, Cardiac Imaging Nurse Navigator Larey Brick, Cardiac Imaging Nurse Navigator [facility] Direct Office Dial: [phone]   For scheduling needs, including cancellations and rescheduling, please call [place], [phone].    Follow-Up: At [facility], you and your health needs are our priority.  As part of our continuing mission to provide you with exceptional heart care, we have created designated Provider Care Teams.  These Care Teams include your primary Cardiologist (physician) and Advanced Practice Providers (APPs -  Physician Assistants and Nurse Practitioners) who all work together to provide you with the care you need, when you need it.  We recommend signing up for the patient portal called "MyChart".  Sign up information is provided on this After Visit Summary.  MyChart is used to  connect with patients for Virtual Visits (Telemedicine).  Patients are able to view lab/test results, encounter notes, upcoming appointments, etc.  Non-urgent messages can be sent to your provider as well.   To learn more about what you can do with MyChart, go to [email or web address].    Your next appointment:   2-3 months   The format for your next appointment:   In  Person  Provider:   Belva Crome, MD   Other Instructions Cardiac CT Angiogram A cardiac CT angiogram is a procedure to look at the heart and the area around the heart. It may be done to help find the cause of chest pains or other symptoms of heart disease. During this procedure, a substance called contrast dye is injected into the blood vessels in the area to be checked. A large X-ray machine, called a CT scanner, then takes detailed pictures of the heart and the surrounding area. The procedure is also sometimes called a coronary CT angiogram, coronary artery scanning, or CTA. A cardiac CT angiogram allows the health care provider to see how well blood is flowing to and from the heart. The health care provider will be able to see if there are any problems, such as: Blockage or narrowing of the coronary arteries in the heart. Fluid around the heart. Signs of weakness or disease in the muscles, valves, and tissues of the heart. Tell a health care provider about: Any allergies you have. This is especially important if you have had a previous allergic reaction to contrast dye. All medicines you are taking, including vitamins, herbs, eye drops, creams, and over-the-counter medicines. Any blood disorders you have. Any surgeries you have had. Any medical conditions you have. Whether you are pregnant or may be pregnant. Any anxiety disorders, chronic pain, or other conditions you have that may increase your stress or prevent you from lying still. What are the risks? Generally, this is a safe procedure. However, problems may occur, including: Bleeding. Infection. Allergic reactions to medicines or dyes. Damage to other structures or organs. Kidney damage from the contrast dye that is used. Increased risk of cancer from radiation exposure. This risk is low. Talk with your health care provider about: The risks and benefits of testing. How you can receive the lowest dose of radiation. What  happens before the procedure? Wear comfortable clothing and remove any jewelry, glasses, dentures, and hearing aids. Follow instructions from your health care provider about eating and drinking. This may include: For 12 hours before the procedure -- avoid caffeine. This includes tea, coffee, soda, energy drinks, and diet pills. Drink plenty of water or other fluids that do not have caffeine in them. Being well hydrated can prevent complications. For 4-6 hours before the procedure -- stop eating and drinking. The contrast dye can cause nausea, but this is less likely if your stomach is empty. Ask your health care provider about changing or stopping your regular medicines. This is especially important if you are taking diabetes medicines, blood thinners, or medicines to treat problems with erections (erectile dysfunction). What happens during the procedure?  Hair on your chest may need to be removed so that small sticky patches called electrodes can be placed on your chest. These will transmit information that helps to monitor your heart during the procedure. An IV will be inserted into one of your veins. You might be given a medicine to control your heart rate during the procedure. This will help to ensure that good images are obtained. You will  be asked to lie on an exam table. This table will slide in and out of the CT machine during the procedure. Contrast dye will be injected into the IV. You might feel warm, or you may get a metallic taste in your mouth. You will be given a medicine called nitroglycerin. This will relax or dilate the arteries in your heart. The table that you are lying on will move into the CT machine tunnel for the scan. The person running the machine will give you instructions while the scans are being done. You may be asked to: Keep your arms above your head. Hold your breath. Stay very still, even if the table is moving. When the scanning is complete, you will be moved out  of the machine. The IV will be removed. The procedure may vary among health care providers and hospitals. What can I expect after the procedure? After your procedure, it is common to have: A metallic taste in your mouth from the contrast dye. A feeling of warmth. A headache from the nitroglycerin. Follow these instructions at home: Take over-the-counter and prescription medicines only as told by your health care provider. If you are told, drink enough fluid to keep your urine pale yellow. This will help to flush the contrast dye out of your body. Most people can return to their normal activities right after the procedure. Ask your health care provider what activities are safe for you. It is up to you to get the results of your procedure. Ask your health care provider, or the department that is doing the procedure, when your results will be ready. Keep all follow-up visits as told by your health care provider. This is important. Contact a health care provider if: You have any symptoms of allergy to the contrast dye. These include: Shortness of breath. Rash or hives. A racing heartbeat. Summary A cardiac CT angiogram is a procedure to look at the heart and the area around the heart. It may be done to help find the cause of chest pains or other symptoms of heart disease. During this procedure, a large X-ray machine, called a CT scanner, takes detailed pictures of the heart and the surrounding area after a contrast dye has been injected into blood vessels in the area. Ask your health care provider about changing or stopping your regular medicines before the procedure. This is especially important if you are taking diabetes medicines, blood thinners, or medicines to treat erectile dysfunction. If you are told, drink enough fluid to keep your urine pale yellow. This will help to flush the contrast dye out of your body. This information is not intended to replace advice given to you by your health  care provider. Make sure you discuss any questions you have with your health care provider. Document Revised: 11/07/2018 Document Reviewed: 11/07/2018 Elsevier Patient Education  2020 [employer].

## 2023-06-29 ENCOUNTER — Other Ambulatory Visit: Payer: Self-pay | Admitting: Cardiology

## 2023-07-18 DIAGNOSIS — I10 Essential (primary) hypertension: Secondary | ICD-10-CM | POA: Diagnosis not present

## 2023-07-18 DIAGNOSIS — I209 Angina pectoris, unspecified: Secondary | ICD-10-CM | POA: Diagnosis not present

## 2023-07-26 DIAGNOSIS — M542 Cervicalgia: Secondary | ICD-10-CM | POA: Diagnosis not present

## 2023-07-26 DIAGNOSIS — I10 Essential (primary) hypertension: Secondary | ICD-10-CM | POA: Diagnosis not present

## 2023-07-26 DIAGNOSIS — I209 Angina pectoris, unspecified: Secondary | ICD-10-CM

## 2023-07-26 DIAGNOSIS — K219 Gastro-esophageal reflux disease without esophagitis: Secondary | ICD-10-CM | POA: Diagnosis not present

## 2023-07-26 DIAGNOSIS — E7800 Pure hypercholesterolemia, unspecified: Secondary | ICD-10-CM | POA: Diagnosis not present

## 2023-07-26 DIAGNOSIS — Z79899 Other long term (current) drug therapy: Secondary | ICD-10-CM | POA: Diagnosis not present

## 2023-07-26 DIAGNOSIS — R002 Palpitations: Secondary | ICD-10-CM | POA: Diagnosis not present

## 2023-07-27 ENCOUNTER — Other Ambulatory Visit: Payer: Self-pay | Admitting: Gastroenterology

## 2023-07-27 ENCOUNTER — Telehealth: Payer: Self-pay

## 2023-07-27 NOTE — Telephone Encounter (Signed)
-----   Message from Essex Revankar sent at 07/26/2023 10:19 AM EDT ----- If she has significant issues with palpitations I would like to know so we can begin her on the beta-blocker.  Kindly call her.  I also think a CT scan is pending for coronary angiography.  The problem with beta-blocker is her borderline blood pressure.  But let me know how she is doing and then I will decide.  Copy primary Nelia Balzarine, MD 07/26/2023 10:18 AM

## 2023-07-27 NOTE — Telephone Encounter (Signed)
 Left vm to return call.

## 2023-07-31 ENCOUNTER — Telehealth (HOSPITAL_COMMUNITY): Payer: Self-pay | Admitting: Emergency Medicine

## 2023-07-31 NOTE — Telephone Encounter (Signed)
 Attempted to call patient regarding upcoming cardiac CT appointment. Left message on voicemail with name and callback number Rockwell Alexandria RN Navigator Cardiac Imaging [facility] [phone] Office [phone] Cell

## 2023-07-31 NOTE — Telephone Encounter (Signed)
 Spoke with patient who states that the past few days her heart rate has been low. Pt request to hold off until after CT 08/01/23.

## 2023-07-31 NOTE — Telephone Encounter (Signed)
 Pt returning nurses call from 5/1. Please advise

## 2023-08-01 ENCOUNTER — Telehealth: Payer: Self-pay | Admitting: Cardiology

## 2023-08-01 ENCOUNTER — Encounter (HOSPITAL_BASED_OUTPATIENT_CLINIC_OR_DEPARTMENT_OTHER): Payer: Self-pay

## 2023-08-01 ENCOUNTER — Ambulatory Visit (HOSPITAL_BASED_OUTPATIENT_CLINIC_OR_DEPARTMENT_OTHER)
Admission: RE | Admit: 2023-08-01 | Discharge: 2023-08-01 | Disposition: A | Discharge status: Home / Self Care | Source: Ambulatory Visit | Attending: Cardiology | Admitting: Cardiology

## 2023-08-01 DIAGNOSIS — I209 Angina pectoris, unspecified: Secondary | ICD-10-CM

## 2023-08-01 MED ORDER — NITROGLYCERIN 0.4 MG SL SUBL
0.8000 mg | SUBLINGUAL_TABLET | Freq: Once | SUBLINGUAL | Status: AC
  Administered 2023-08-01: 0.8 mg via SUBLINGUAL

## 2023-08-01 NOTE — Telephone Encounter (Signed)
 Patient states that she wasn't able to get the CT scan done today. She stated they weren't able to get the dye in her. aPlease advise

## 2023-08-02 NOTE — Telephone Encounter (Signed)
 Pt has an appointment 08/03/23. Pt does not want to attempt the CT but rather move to a cath.

## 2023-08-03 ENCOUNTER — Ambulatory Visit: Admitting: Cardiology

## 2023-08-03 NOTE — Telephone Encounter (Signed)
 LVM to call regarding message.

## 2023-08-03 NOTE — Telephone Encounter (Signed)
 Patient called stating she was not feeling well and is unable to make her appointment this morning but wants a call back to discuss next steps.

## 2023-08-04 NOTE — Telephone Encounter (Signed)
 Left message for the patient to call back.

## 2023-08-07 NOTE — Telephone Encounter (Signed)
 Spoke with pt who request to wait until her appointment June 25 to discuss. Advised if she begins having chest pain go to the ED for evaluation. Pt verbalized understanding and had no additional questions.

## 2023-08-08 ENCOUNTER — Ambulatory Visit (HOSPITAL_BASED_OUTPATIENT_CLINIC_OR_DEPARTMENT_OTHER)

## 2023-08-11 DIAGNOSIS — H52223 Regular astigmatism, bilateral: Secondary | ICD-10-CM | POA: Diagnosis not present

## 2023-08-11 DIAGNOSIS — H26492 Other secondary cataract, left eye: Secondary | ICD-10-CM | POA: Diagnosis not present

## 2023-08-11 DIAGNOSIS — Z961 Presence of intraocular lens: Secondary | ICD-10-CM | POA: Diagnosis not present

## 2023-08-11 DIAGNOSIS — H35032 Hypertensive retinopathy, left eye: Secondary | ICD-10-CM | POA: Diagnosis not present

## 2023-08-11 DIAGNOSIS — H524 Presbyopia: Secondary | ICD-10-CM | POA: Diagnosis not present

## 2023-08-11 DIAGNOSIS — H18513 Endothelial corneal dystrophy, bilateral: Secondary | ICD-10-CM | POA: Diagnosis not present

## 2023-08-11 DIAGNOSIS — H43812 Vitreous degeneration, left eye: Secondary | ICD-10-CM | POA: Diagnosis not present

## 2023-08-23 DIAGNOSIS — J4540 Moderate persistent asthma, uncomplicated: Secondary | ICD-10-CM | POA: Diagnosis not present

## 2023-08-23 DIAGNOSIS — J301 Allergic rhinitis due to pollen: Secondary | ICD-10-CM | POA: Diagnosis not present

## 2023-08-23 DIAGNOSIS — J479 Bronchiectasis, uncomplicated: Secondary | ICD-10-CM | POA: Diagnosis not present

## 2023-08-23 DIAGNOSIS — F17210 Nicotine dependence, cigarettes, uncomplicated: Secondary | ICD-10-CM | POA: Diagnosis not present

## 2023-08-26 DIAGNOSIS — M810 Age-related osteoporosis without current pathological fracture: Secondary | ICD-10-CM | POA: Diagnosis not present

## 2023-08-26 DIAGNOSIS — Z79899 Other long term (current) drug therapy: Secondary | ICD-10-CM | POA: Diagnosis not present

## 2023-08-26 DIAGNOSIS — E7800 Pure hypercholesterolemia, unspecified: Secondary | ICD-10-CM | POA: Diagnosis not present

## 2023-08-26 DIAGNOSIS — K219 Gastro-esophageal reflux disease without esophagitis: Secondary | ICD-10-CM | POA: Diagnosis not present

## 2023-08-26 DIAGNOSIS — I10 Essential (primary) hypertension: Secondary | ICD-10-CM | POA: Diagnosis not present

## 2023-08-26 DIAGNOSIS — M542 Cervicalgia: Secondary | ICD-10-CM | POA: Diagnosis not present

## 2023-09-20 ENCOUNTER — Ambulatory Visit: Attending: Cardiology | Admitting: Cardiology

## 2023-09-20 ENCOUNTER — Encounter: Payer: Self-pay | Admitting: Cardiology

## 2023-09-20 VITALS — BP 136/74 | HR 62 | Ht 66.0 in | Wt 169.6 lb

## 2023-09-20 DIAGNOSIS — I10 Essential (primary) hypertension: Secondary | ICD-10-CM | POA: Diagnosis not present

## 2023-09-20 DIAGNOSIS — I25119 Atherosclerotic heart disease of native coronary artery with unspecified angina pectoris: Secondary | ICD-10-CM

## 2023-09-20 DIAGNOSIS — I2510 Atherosclerotic heart disease of native coronary artery without angina pectoris: Secondary | ICD-10-CM | POA: Diagnosis not present

## 2023-09-20 DIAGNOSIS — J431 Panlobular emphysema: Secondary | ICD-10-CM | POA: Diagnosis not present

## 2023-09-20 DIAGNOSIS — E782 Mixed hyperlipidemia: Secondary | ICD-10-CM | POA: Diagnosis not present

## 2023-09-20 DIAGNOSIS — I209 Angina pectoris, unspecified: Secondary | ICD-10-CM | POA: Diagnosis not present

## 2023-09-20 DIAGNOSIS — F17210 Nicotine dependence, cigarettes, uncomplicated: Secondary | ICD-10-CM | POA: Diagnosis not present

## 2023-09-20 NOTE — Patient Instructions (Signed)
 Medication Instructions:  Your physician recommends that you continue on your current medications as directed. Please refer to the Current Medication list given to you today.   *If you need a refill on your cardiac medications before your next appointment, please call your pharmacy*   Lab Work: Your physician recommends that you have a BMET and CBC today in the office for your upcoming procedure.  If you have labs (blood work) drawn today and your tests are completely normal, you will receive your results only by: MyChart Message (if you have MyChart) OR A paper copy in the mail If you have any lab test that is abnormal or we need to change your treatment, we will call you to review the results.   Testing/Procedures:  [place] [facility] DEPT OF [facility] AT  [address] [place] [address] Dept: [phone] Loc: [phone]  Laurie Barr  09/20/2023  You are scheduled for a Cardiac Catheterization on Tuesday, July 8 with Dr. Alm Clay.  1. Please arrive at the [facility] (Main Entrance A) at [facility]: [address] [place], [place] [address] at 8:30 AM (This time is 2 hour(s) before your procedure to ensure your preparation).   Free valet parking service is available. You will check in at ADMITTING. The support person will be asked to wait in the waiting room.  It is OK to have someone drop you off and come back when you are ready to be discharged.    Special note: Every effort is made to have your procedure done on time. Please understand that emergencies sometimes delay scheduled procedures.  2. Diet: Do not eat solid foods after midnight.  The patient may have clear liquids until 5am upon the day of the procedure.  3. Labs: You will had labs done in the office.  4. Medication instructions in preparation for your procedure:   Contrast Allergy: No  Stop taking, Cozaar (Losartan)  Tuesday, July 8,  On the morning of your procedure, take your Aspirin 81 mg and any morning medicines NOT listed above.  You may use sips of water.  5. Plan to go home the same day, you will only stay overnight if medically necessary. 6. Bring a current list of your medications and current insurance cards. 7. You MUST have a responsible person to drive you home. 8. Someone MUST be with you the first 24 hours after you arrive home or your discharge will be delayed. 9. Please wear clothes that are easy to get on and off and wear slip-on shoes.  Thank you for allowing us  to care for you!   -- [place]    Follow-Up: At [facility], you and your health needs are our priority.  As part of our continuing mission to provide you with exceptional heart care, we have created designated Provider Care Teams.  These Care Teams include your primary Cardiologist (physician) and Advanced Practice Providers (APPs -  Physician Assistants and Nurse Practitioners) who all work together to provide you with the care you need, when you need it.  We recommend signing up for the patient portal called MyChart.  Sign up information is provided on this After Visit Summary.  MyChart is used to connect with patients for Virtual Visits (Telemedicine).  Patients are able to view lab/test results, encounter notes, upcoming appointments, etc.  Non-urgent messages can be sent to your provider as well.   To learn  more about what you can do with MyChart, go to [email or web address].    Your next appointment:   5-6 weeks  The format for your next appointment:   In Person  Provider:   Jennifer Crape, MD   Other Instructions  Coronary Angiogram With Stent Coronary angiogram with stent placement is a procedure to widen or open a narrow blood vessel of the heart (coronary artery). Arteries may become blocked by cholesterol buildup (plaques) in the lining of the artery wall. When a  coronary artery becomes partially blocked, blood flow to that area decreases. This may lead to chest pain or a heart attack (myocardial infarction). A stent is a small piece of metal that looks like mesh or spring. Stent placement may be done as treatment after a heart attack, or to prevent a heart attack if a blocked artery is found by a coronary angiogram. Let your health care provider know about: Any allergies you have, including allergies to medicines or contrast dye. All medicines you are taking, including vitamins, herbs, eye drops, creams, and over-the-counter medicines. Any problems you or family members have had with anesthetic medicines. Any blood disorders you have. Any surgeries you have had. Any medical conditions you have, including kidney problems or kidney failure. Whether you are pregnant or may be pregnant. Whether you are breastfeeding. What are the risks? Generally, this is a safe procedure. However, serious problems may occur, including: Damage to nearby structures or organs, such as the heart, blood vessels, or kidneys. A return of blockage. Bleeding, infection, or bruising at the insertion site. A collection of blood under the skin (hematoma) at the insertion site. A blood clot in another part of the body. Allergic reaction to medicines or dyes. Bleeding into the abdomen (retroperitoneal bleeding). Stroke (rare). Heart attack (rare). What happens before the procedure? Staying hydrated Follow instructions from your health care provider about hydration, which may include: Up to 2 hours before the procedure - you may continue to drink clear liquids, such as water, clear fruit juice, black coffee, and plain tea.    Eating and drinking restrictions Follow instructions from your health care provider about eating and drinking, which may include: 8 hours before the procedure - stop eating heavy meals or foods, such as meat, fried foods, or fatty foods. 6 hours before the  procedure - stop eating light meals or foods, such as toast or cereal. 2 hours before the procedure - stop drinking clear liquids. Medicines Ask your health care provider about: Changing or stopping your regular medicines. This is especially important if you are taking diabetes medicines or blood thinners. Taking medicines such as aspirin and ibuprofen. These medicines can thin your blood. Do not take these medicines unless your health care provider tells you to take them. Generally, aspirin is recommended before a thin tube, called a catheter, is passed through a blood vessel and inserted into the heart (cardiac catheterization). Taking over-the-counter medicines, vitamins, herbs, and supplements. General instructions Do not use any products that contain nicotine or tobacco for at least 4 weeks before the procedure. These products include cigarettes, e-cigarettes, and chewing tobacco. If you need help quitting, ask your health care provider. Plan to have someone take you home from the hospital or clinic. If you will be going home right after the procedure, plan to have someone with you for 24 hours. You may have tests and imaging procedures. Ask your health care provider: How your insertion site will be marked. Ask which artery will be used  for the procedure. What steps will be taken to help prevent infection. These may include: Removing hair at the insertion site. Washing skin with a germ-killing soap. Taking antibiotic medicine. What happens during the procedure? An IV will be inserted into one of your veins. Electrodes may be placed on your chest to monitor your heart rate during the procedure. You will be given one or more of the following: A medicine to help you relax (sedative). A medicine to numb the area (local anesthetic) for catheter insertion. A small incision will be made for catheter insertion. The catheter will be inserted into an artery using a guide wire. The location may  be in your groin, your wrist, or the fold of your arm (near your elbow). An X-ray procedure (fluoroscopy) will be used to help guide the catheter to the opening of the heart arteries. A dye will be injected into the catheter. X-rays will be taken. The dye helps to show where any narrowing or blockages are located in the arteries. Tell your health care provider if you have chest pain or trouble breathing. A tiny wire will be guided to the blocked spot, and a balloon will be inflated to make the artery wider. The stent will be expanded to crush the plaques into the wall of the vessel. The stent will hold the area open and improve the blood flow. Most stents have a drug coating to reduce the risk of the stent narrowing over time. The artery may be made wider using a drill, laser, or other tools that remove plaques. The catheter will be removed when the blood flow improves. The stent will stay where it was placed, and the lining of the artery will grow over it. A bandage (dressing) will be placed on the insertion site. Pressure will be applied to stop bleeding. The IV will be removed. This procedure may vary among health care providers and hospitals.    What happens after the procedure? Your blood pressure, heart rate, breathing rate, and blood oxygen  level will be monitored until you leave the hospital or clinic. If the procedure is done through the leg, you will lie flat in bed for a few hours or for as long as told by your health care provider. You will be instructed not to bend or cross your legs. The insertion site and the pulse in your foot or wrist will be checked often. You may have more blood tests, X-rays, and a test that records the electrical activity of your heart (electrocardiogram, or ECG). Do not drive for 24 hours if you were given a sedative during your procedure. Summary Coronary angiogram with stent placement is a procedure to widen or open a narrowed coronary artery. This is done  to treat heart problems. Before the procedure, let your health care provider know about all the medical conditions and surgeries you have or have had. This is a safe procedure. However, some problems may occur, including damage to nearby structures or organs, bleeding, blood clots, or allergies. Follow your health care provider's instructions about eating, drinking, medicines, and other lifestyle changes, such as quitting tobacco use before the procedure. This information is not intended to replace advice given to you by your health care provider. Make sure you discuss any questions you have with your health care provider. Document Revised: 10/03/2018 Document Reviewed: 10/03/2018 Elsevier Patient Education  2021 Elsevier Inc.  Aspirin and Your Heart Aspirin is a medicine that prevents the platelets in your blood from sticking together. Platelets are  the cells that your blood uses for clotting. Aspirin can be used to help reduce the risk of blood clots, heart attacks, and other heart-related problems. What are the risks? Daily use of aspirin can cause side effects. Some of these include: Bleeding. Bleeding can be minor or serious. An example of minor bleeding is bleeding from a cut, and the bleeding does not stop. An example of more serious bleeding is stomach bleeding or, rarely, bleeding into the brain. Your risk of bleeding increases if you are also taking NSAIDs, such as ibuprofen. Increased bruising. Upset stomach. An allergic reaction. People who have growths inside the nose (nasal polyps) have an increased risk of developing an aspirin allergy. How to use aspirin to care for your heart Take aspirin only as told by your health care provider. Make sure that you understand how much to take and what form to take. The two forms of aspirin are: Non-enteric-coated.This type of aspirin does not have a coating and is absorbed quickly. This type of aspirin also comes in a chewable  form. Enteric-coated. This type of aspirin has a coating that releases the medicine very slowly. Enteric-coated aspirin might cause less stomach upset than non-enteric-coated aspirin. This type of aspirin should not be chewed or crushed. Work with your health care provider to find out whether it is safe and beneficial for you to take aspirin daily. Taking aspirin daily may be helpful if: You have had a heart attack or chest pain, or you are at risk for a heart attack. You have a condition in which certain heart vessels are blocked (coronary artery disease), and you have had a procedure to treat it. Examples are: Open-heart surgery, such as coronary artery bypass surgery (CABG). Coronary angioplasty,which is done to widen a blood vessel of your heart. Having a small mesh tube, or stent, placed in your coronary artery. You have had certain types of stroke or a mini-stroke known as a transient ischemic attack (TIA). You have a narrowing of the arteries that supply the limbs (peripheral artery disease, or PAD). You have long-term (chronic) heart rhythm problems, such as atrial fibrillation, and your health care provider thinks aspirin may help. You have valve disease or have had surgery on a valve. You are considered at increased risk of developing coronary artery disease or PAD.    Follow these instructions at home Medicines Take over-the-counter and prescription medicines only as told by your health care provider. If you are taking blood thinners: Talk with your health care provider before you take any medicines that contain aspirin or NSAIDs, such as ibuprofen. These medicines increase your risk for dangerous bleeding. Take your medicine exactly as told, at the same time every day. Avoid activities that could cause injury or bruising, and follow instructions about how to prevent falls. Wear a medical alert bracelet or carry a card that lists what medicines you take. General instructions Do not  drink alcohol  if: Your health care provider tells you not to drink. You are pregnant, may be pregnant, or are planning to become pregnant. If you drink alcohol : Limit how much you use to: 0-1 drink a day for women. 0-2 drinks a day for men. Be aware of how much alcohol  is in your drink. In the [place] one drink equals one 12 oz bottle of beer (355 mL), one 5 oz glass of wine (148 mL), or one 1 oz glass of hard liquor (44 mL). Keep all follow-up visits as told by your health care provider. This  is important. Where to find more information The American Heart Association: www.heart.org Contact a health care provider if you have: Unusual bleeding or bruising. Stomach pain or nausea. Ringing in your ears. An allergic reaction that causes hives, itchy skin, or swelling of the lips, tongue, or face. Get help right away if: You notice that your bowel movements are bloody, or dark red or black in color. You vomit or cough up blood. You have blood in your urine. You cough, breathe loudly (wheeze), or feel short of breath. You have chest pain, especially if the pain spreads to your arms, back, neck, or jaw. You have a headache with confusion. You have any symptoms of a stroke. BE FAST is an easy way to remember the main warning signs of a stroke: B - Balance. Signs are dizziness, sudden trouble walking, or loss of balance. E - Eyes. Signs are trouble seeing or a sudden change in vision. F - Face. Signs are sudden weakness or numbness of the face, or the face or eyelid drooping on one side. A - Arms. Signs are weakness or numbness in an arm. This happens suddenly and usually on one side of the body. S - Speech. Signs are sudden trouble speaking, slurred speech, or trouble understanding what people say. T - Time. Time to call emergency services. Write down what time symptoms started. You have other signs of a stroke, such as: A sudden, severe headache with no known cause. Nausea or  vomiting. Seizure. These symptoms may represent a serious problem that is an emergency. Do not wait to see if the symptoms will go away. Get medical help right away. Call your local emergency services (911 in the [place] Do not drive yourself to the hospital. Summary Aspirin use can help reduce the risk of blood clots, heart attacks, and other heart-related problems. Daily use of aspirin can cause side effects. Take aspirin only as told by your health care provider. Make sure that you understand how much to take and what form to take. Your health care provider will help you determine whether it is safe and beneficial for you to take aspirin daily. This information is not intended to replace advice given to you by your health care provider. Make sure you discuss any questions you have with your health care provider. Document Revised: 12/17/2018 Document Reviewed: 12/17/2018 Elsevier Patient Education  2021 Elsevier Inc. Nitroglycerin  sublingual tablets What is this medicine? NITROGLYCERIN  (nye troe GLI ser in) is a type of vasodilator. It relaxes blood vessels, increasing the blood and oxygen  supply to your heart. This medicine is used to relieve chest pain caused by angina. It is also used to prevent chest pain before activities like climbing stairs, going outdoors in cold weather, or sexual activity. This medicine may be used for other purposes; ask your health care provider or pharmacist if you have questions. COMMON BRAND NAME(S): Nitroquick, Nitrostat , Nitrotab What should I tell my health care provider before I take this medicine? They need to know if you have any of these conditions: anemia head injury, recent stroke, or bleeding in the brain liver disease previous heart attack an unusual or allergic reaction to nitroglycerin , other medicines, foods, dyes, or preservatives pregnant or trying to get pregnant breast-feeding How should I use this medicine? Take this medicine by mouth as  needed. Use at the first sign of an angina attack (chest pain or tightness). You can also take this medicine 5 to 10 minutes before an event likely to produce chest  pain. Follow the directions exactly as written on the prescription label. Place one tablet under your tongue and let it dissolve. Do not swallow whole. Replace the dose if you accidentally swallow it. It will help if your mouth is not dry. Saliva around the tablet will help it to dissolve more quickly. Do not eat or drink, smoke or chew tobacco while a tablet is dissolving. Sit down when taking this medicine. In an angina attack, you should feel better within 5 minutes after your first dose. You can take a dose every 5 minutes up to a total of 3 doses. If you do not feel better or feel worse after 1 dose, call 9-1-1 at once. Do not take more than 3 doses in 15 minutes. Your health care provider might give you other directions. Follow those directions if he or she does. Do not take your medicine more often than directed. Talk to your health care provider about the use of this medicine in children. Special care may be needed. Overdosage: If you think you have taken too much of this medicine contact a poison control center or emergency room at once. NOTE: This medicine is only for you. Do not share this medicine with others. What if I miss a dose? This does not apply. This medicine is only used as needed. What may interact with this medicine? Do not take this medicine with any of the following medications: certain migraine medicines like ergotamine and dihydroergotamine (DHE) medicines used to treat erectile dysfunction like sildenafil, tadalafil, and vardenafil riociguat This medicine may also interact with the following medications: alteplase aspirin heparin medicines for high blood pressure medicines for mental depression other medicines used to treat angina phenothiazines like chlorpromazine, mesoridazine, prochlorperazine,  thioridazine This list may not describe all possible interactions. Give your health care provider a list of all the medicines, herbs, non-prescription drugs, or dietary supplements you use. Also tell them if you smoke, drink alcohol , or use illegal drugs. Some items may interact with your medicine. What should I watch for while using this medicine? Tell your doctor or health care professional if you feel your medicine is no longer working. Keep this medicine with you at all times. Sit or lie down when you take your medicine to prevent falling if you feel dizzy or faint after using it. Try to remain calm. This will help you to feel better faster. If you feel dizzy, take several deep breaths and lie down with your feet propped up, or bend forward with your head resting between your knees. You may get drowsy or dizzy. Do not drive, use machinery, or do anything that needs mental alertness until you know how this drug affects you. Do not stand or sit up quickly, especially if you are an older patient. This reduces the risk of dizzy or fainting spells. Alcohol  can make you more drowsy and dizzy. Avoid alcoholic drinks. Do not treat yourself for coughs, colds, or pain while you are taking this medicine without asking your doctor or health care professional for advice. Some ingredients may increase your blood pressure. What side effects may I notice from receiving this medicine? Side effects that you should report to your doctor or health care professional as soon as possible: allergic reactions (skin rash, itching or hives; swelling of the face, lips, or tongue) low blood pressure (dizziness; feeling faint or lightheaded, falls; unusually weak or tired) low red blood cell counts (trouble breathing; feeling faint; lightheaded, falls; unusually weak or tired) Side effects that  usually do not require medical attention (report to your doctor or health care professional if they continue or are bothersome): facial  flushing (redness) headache nausea, vomiting This list may not describe all possible side effects. Call your doctor for medical advice about side effects. You may report side effects to FDA at 1-800-FDA-1088. Where should I keep my medicine? Keep out of the reach of children. Store at room temperature between 20 and 25 degrees C (68 and 77 degrees F). Store in Retail buyer. Protect from light and moisture. Keep tightly closed. Throw away any unused medicine after the expiration date. NOTE: This sheet is a summary. It may not cover all possible information. If you have questions about this medicine, talk to your doctor, pharmacist, or health care provider.  2021 Elsevier/Gold Standard (2017-12-13 16:46:32)

## 2023-09-20 NOTE — Progress Notes (Signed)
 Cardiology Office Note:    Date:  09/20/2023   ID:  Laurie Barr, DOB 1950/07/24, MRN [id number]  PCP:  Laurie Males, MD  Cardiologist:  Laurie JONELLE Crape, MD   Referring MD: Laurie Males, MD    ASSESSMENT:    1. Mixed dyslipidemia   2. Angina pectoris (HCC)   3. Coronary artery disease involving native coronary artery of native heart, unspecified whether angina present   4. Essential hypertension   5. Panlobular emphysema (HCC)   6. Cigarette smoker    PLAN:    In order of problems listed above:  Angina pectoris and dyspnea on exertion in a patient with known coronary artery disease: I discussed my findings with the patient at length.  She is taking aspirin and using sublingual nitroglycerin  on a as needed basis.  I discussed with her the following.I discussed coronary angiography and left heart catheterization with the patient at extensive length. Procedure, benefits and potential risks were explained. Patient had multiple questions which were answered to the patient's satisfaction. Patient agreed and consented for the procedure. Further recommendations will be made based on the findings of the coronary angiography. In the interim. The patient has any significant symptoms he knows to go to the nearest emergency room. Cigarette smoker: I spent 5 minutes with the patient discussing solely about smoking. Smoking cessation was counseled. I suggested to the patient also different medications and pharmacological interventions. Patient is keen to try stopping on its own at this time. He will get back to me if he needs any further assistance in this matter. Essential hypertension: Blood pressure stable and diet was emphasized. Mixed dyslipidemia: On lipid-lowering medications.  Diet emphasized.  Lipids reviewed. She will be seen in follow-up appointment after the coronary angiography.   Medication Adjustments/Labs and Tests Ordered: Current medicines are reviewed at length with the  patient today.  Concerns regarding medicines are outlined above.  Orders Placed This Encounter  Procedures   EKG 12-Lead   No orders of the defined types were placed in this encounter.    No chief complaint on file.    History of Present Illness:    Laurie Barr is a 73 y.o. female.  Patient has been evaluated for chest pain and dyspnea on exertion.  She has history of coronary artery disease, essential hypertension, mixed dyslipidemia and unfortunately continues to smoke.  She mentions to me that they had an issue initiating her IV and therefore CT had to be abandoned.  She continues to have dyspnea on exertion which is significant and affects her quality of life.  She also has chest tightness at times.  At the time of my evaluation, the patient is alert awake oriented and in no distress.  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   Angina pectoris (HCC) 06/28/2023   Anxiety    Asthma    Bronchiectasis (HCC) 10/01/2015   CAD (coronary artery disease) 12/19/2019   Chest tightness 08/12/2019   Cigarette smoker 10/20/2020   COPD (chronic obstructive pulmonary disease) (HCC) 10/01/2015   Dyspnea on exertion 08/12/2019   Endometriosis    Essential hypertension 06/28/2023   Ex-smoker 08/12/2019   GERD (gastroesophageal reflux disease)    History of colonic polyps    Hypercholesterolemia    Hypertrophy of bone, left ankle and foot 05/05/2016   IBS (irritable bowel syndrome)    Mixed dyslipidemia 08/12/2019   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 Mild sigmoid diverticulosis   CYSTOSCOPY  11/17/2016   ELBOW SURGERY Left    removed bone due to fall/ Dr. Barbarann   ESOPHAGOGASTRODUODENOSCOPY  11/19/2015   Mild gastritis. Transient hiatal hernia. Otherwise, normal EGD   HAND SURGERY Right    removed cyst   LAPAROTOMY     multiple due to endometriosis fibrous tumors and adhesions   STOMACH SURGERY     Tumor removed size of grapefruit    Current Medications: Current Meds  Medication Sig   albuterol  (PROVENTIL  HFA;VENTOLIN  HFA) 108 (90 Base) MCG/ACT inhaler Inhale 2 puffs into the lungs every 6 (six) hours as needed for wheezing or shortness of breath.   aspirin 81 MG tablet Take 81 mg by mouth daily.   baclofen (LIORESAL) 10 MG tablet Take 10 mg by mouth 2 (two) times daily as needed for muscle spasms.   citalopram (CELEXA) 20 MG tablet Take 10 mg by mouth daily.   dicyclomine  (BENTYL ) 10 MG capsule TAKE 1 CAPSULE(10 MG) BY MOUTH TWICE DAILY   EPINEPHrine 0.3 mg/0.3 mL IJ SOAJ injection Inject 0.3 mg into the muscle as needed for anaphylaxis.   fluticasone (FLONASE) 50 MCG/ACT nasal spray Place 2 sprays into both nostrils daily.   Fluticasone-Umeclidin-Vilant (TRELEGY ELLIPTA) 100-62.5-25 MCG/ACT AEPB Inhale 1 puff into the lungs daily.   losartan (COZAAR) 50 MG tablet Take 50 mg by mouth daily.   montelukast (SINGULAIR) 10 MG tablet Take 10 mg by mouth daily.   mupirocin ointment (BACTROBAN) 2 % Apply 1 Application topically as needed (rash).   nitroGLYCERIN  (NITROSTAT ) 0.4 MG SL tablet Place 1 tablet (0.4 mg total) under the tongue every 5 (five) minutes as needed for chest pain.   pantoprazole  (PROTONIX ) 40 MG tablet TAKE 1 TABLET(40 MG) BY MOUTH DAILY   Probiotic Product (PROBIOTIC PO) Take 1 tablet by mouth daily.   rosuvastatin (CRESTOR) 40 MG tablet Take 40 mg by mouth daily.     Allergies:   Patient has no known allergies.   Social History   Socioeconomic History    Marital status: Unknown    Spouse name: Not on file   Number of children: Not on file   Years of education: Not on file   Highest education level: Not on file  Occupational History   Not on file  Tobacco Use   Smoking status: Former    Current packs/day: 0.00    Types: Cigarettes    Quit date: 08/12/2015    Years since quitting: 8.1   Smokeless tobacco: Never  Vaping Use   Vaping status: Never Used  Substance and Sexual Activity   Alcohol  use: Not Currently   Drug use: Never   Sexual activity: Not Currently  Other Topics Concern   Not on file  Social History Narrative   Not on file   Social Drivers of Health   Financial Resource Strain: Not on file  Food Insecurity: Not on file  Transportation Needs: Not on file  Physical Activity: Not on file  Stress: Not on file  Social Connections: Not on file  Family History: The patient's family history includes Cancer in her father; Diabetes in her brother and sister; Hypertension in her brother, father, mother, and sister. There is no history of Colon cancer, Esophageal cancer, or Heart disease.  ROS:   Please see the history of present illness.    All other systems reviewed and are negative.  EKGs/Labs/Other Studies Reviewed:    The following studies were reviewed today: .SABRAEKG Interpretation Date/Time:  Wednesday September 20 2023 09:51:20 EDT Ventricular Rate:  62 PR Interval:  170 QRS Duration:  132 QT Interval:  426 QTC Calculation: 432 R Axis:   -27  Text Interpretation: Normal sinus rhythm Left bundle branch block Abnormal ECG When compared with ECG of 28-Jun-2023 08:04, Nonspecific T wave abnormality no longer evident in Inferior leads Confirmed by Edwyna Backers [phone]) on 09/20/2023 10:17:24 AM     Recent Labs: No results found for requested labs within last 365 days.  Recent Lipid Panel    Component Value Date/Time   CHOL 160 06/03/2021 1108   TRIG 186 (H) 06/03/2021 1108   HDL 68 06/03/2021 1108    CHOLHDL 2.4 06/03/2021 1108   LDLCALC 62 06/03/2021 1108    Physical Exam:    VS:  BP 136/74   Pulse 62   Ht 5' 6 (1.676 m)   Wt 169 lb 9.6 oz (76.9 kg)   SpO2 93%   BMI 27.37 kg/m     Wt Readings from Last 3 Encounters:  09/20/23 169 lb 9.6 oz (76.9 kg)  06/28/23 172 lb 3.2 oz (78.1 kg)  01/24/23 175 lb (79.4 kg)     GEN: Patient is in no acute distress HEENT: Normal NECK: No JVD; No carotid bruits LYMPHATICS: No lymphadenopathy CARDIAC: Hear sounds regular, 2/6 systolic murmur at the apex. RESPIRATORY:  Clear to auscultation without rales, wheezing or rhonchi  ABDOMEN: Soft, non-tender, non-distended MUSCULOSKELETAL:  No edema; No deformity  SKIN: Warm and dry NEUROLOGIC:  Alert and oriented x 3 PSYCHIATRIC:  Normal affect   Signed, Backers JONELLE Edwyna, MD  09/20/2023 10:33 AM    [facility]

## 2023-09-20 NOTE — H&P (View-Only) (Signed)
 Cardiology Office Note:    Date:  09/20/2023   ID:  Laurie Barr, DOB 1950/07/24, MRN [id number]  PCP:  Laurie Males, MD  Cardiologist:  Laurie JONELLE Crape, MD   Referring MD: Laurie Males, MD    ASSESSMENT:    1. Mixed dyslipidemia   2. Angina pectoris (HCC)   3. Coronary artery disease involving native coronary artery of native heart, unspecified whether angina present   4. Essential hypertension   5. Panlobular emphysema (HCC)   6. Cigarette smoker    PLAN:    In order of problems listed above:  Angina pectoris and dyspnea on exertion in a patient with known coronary artery disease: I discussed my findings with the patient at length.  She is taking aspirin and using sublingual nitroglycerin  on a as needed basis.  I discussed with her the following.I discussed coronary angiography and left heart catheterization with the patient at extensive length. Procedure, benefits and potential risks were explained. Patient had multiple questions which were answered to the patient's satisfaction. Patient agreed and consented for the procedure. Further recommendations will be made based on the findings of the coronary angiography. In the interim. The patient has any significant symptoms he knows to go to the nearest emergency room. Cigarette smoker: I spent 5 minutes with the patient discussing solely about smoking. Smoking cessation was counseled. I suggested to the patient also different medications and pharmacological interventions. Patient is keen to try stopping on its own at this time. He will get back to me if he needs any further assistance in this matter. Essential hypertension: Blood pressure stable and diet was emphasized. Mixed dyslipidemia: On lipid-lowering medications.  Diet emphasized.  Lipids reviewed. She will be seen in follow-up appointment after the coronary angiography.   Medication Adjustments/Labs and Tests Ordered: Current medicines are reviewed at length with the  patient today.  Concerns regarding medicines are outlined above.  Orders Placed This Encounter  Procedures   EKG 12-Lead   No orders of the defined types were placed in this encounter.    No chief complaint on file.    History of Present Illness:    Laurie Barr is a 73 y.o. female.  Patient has been evaluated for chest pain and dyspnea on exertion.  She has history of coronary artery disease, essential hypertension, mixed dyslipidemia and unfortunately continues to smoke.  She mentions to me that they had an issue initiating her IV and therefore CT had to be abandoned.  She continues to have dyspnea on exertion which is significant and affects her quality of life.  She also has chest tightness at times.  At the time of my evaluation, the patient is alert awake oriented and in no distress.  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   Angina pectoris (HCC) 06/28/2023   Anxiety    Asthma    Bronchiectasis (HCC) 10/01/2015   CAD (coronary artery disease) 12/19/2019   Chest tightness 08/12/2019   Cigarette smoker 10/20/2020   COPD (chronic obstructive pulmonary disease) (HCC) 10/01/2015   Dyspnea on exertion 08/12/2019   Endometriosis    Essential hypertension 06/28/2023   Ex-smoker 08/12/2019   GERD (gastroesophageal reflux disease)    History of colonic polyps    Hypercholesterolemia    Hypertrophy of bone, left ankle and foot 05/05/2016   IBS (irritable bowel syndrome)    Mixed dyslipidemia 08/12/2019   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 Mild sigmoid diverticulosis   CYSTOSCOPY  11/17/2016   ELBOW SURGERY Left    removed bone due to fall/ Dr. Barbarann   ESOPHAGOGASTRODUODENOSCOPY  11/19/2015   Mild gastritis. Transient hiatal hernia. Otherwise, normal EGD   HAND SURGERY Right    removed cyst   LAPAROTOMY     multiple due to endometriosis fibrous tumors and adhesions   STOMACH SURGERY     Tumor removed size of grapefruit    Current Medications: Current Meds  Medication Sig   albuterol  (PROVENTIL  HFA;VENTOLIN  HFA) 108 (90 Base) MCG/ACT inhaler Inhale 2 puffs into the lungs every 6 (six) hours as needed for wheezing or shortness of breath.   aspirin 81 MG tablet Take 81 mg by mouth daily.   baclofen (LIORESAL) 10 MG tablet Take 10 mg by mouth 2 (two) times daily as needed for muscle spasms.   citalopram (CELEXA) 20 MG tablet Take 10 mg by mouth daily.   dicyclomine  (BENTYL ) 10 MG capsule TAKE 1 CAPSULE(10 MG) BY MOUTH TWICE DAILY   EPINEPHrine 0.3 mg/0.3 mL IJ SOAJ injection Inject 0.3 mg into the muscle as needed for anaphylaxis.   fluticasone (FLONASE) 50 MCG/ACT nasal spray Place 2 sprays into both nostrils daily.   Fluticasone-Umeclidin-Vilant (TRELEGY ELLIPTA) 100-62.5-25 MCG/ACT AEPB Inhale 1 puff into the lungs daily.   losartan (COZAAR) 50 MG tablet Take 50 mg by mouth daily.   montelukast (SINGULAIR) 10 MG tablet Take 10 mg by mouth daily.   mupirocin ointment (BACTROBAN) 2 % Apply 1 Application topically as needed (rash).   nitroGLYCERIN  (NITROSTAT ) 0.4 MG SL tablet Place 1 tablet (0.4 mg total) under the tongue every 5 (five) minutes as needed for chest pain.   pantoprazole  (PROTONIX ) 40 MG tablet TAKE 1 TABLET(40 MG) BY MOUTH DAILY   Probiotic Product (PROBIOTIC PO) Take 1 tablet by mouth daily.   rosuvastatin (CRESTOR) 40 MG tablet Take 40 mg by mouth daily.     Allergies:   Patient has no known allergies.   Social History   Socioeconomic History    Marital status: Unknown    Spouse name: Not on file   Number of children: Not on file   Years of education: Not on file   Highest education level: Not on file  Occupational History   Not on file  Tobacco Use   Smoking status: Former    Current packs/day: 0.00    Types: Cigarettes    Quit date: 08/12/2015    Years since quitting: 8.1   Smokeless tobacco: Never  Vaping Use   Vaping status: Never Used  Substance and Sexual Activity   Alcohol  use: Not Currently   Drug use: Never   Sexual activity: Not Currently  Other Topics Concern   Not on file  Social History Narrative   Not on file   Social Drivers of Health   Financial Resource Strain: Not on file  Food Insecurity: Not on file  Transportation Needs: Not on file  Physical Activity: Not on file  Stress: Not on file  Social Connections: Not on file  Family History: The patient's family history includes Cancer in her father; Diabetes in her brother and sister; Hypertension in her brother, father, mother, and sister. There is no history of Colon cancer, Esophageal cancer, or Heart disease.  ROS:   Please see the history of present illness.    All other systems reviewed and are negative.  EKGs/Labs/Other Studies Reviewed:    The following studies were reviewed today: .SABRAEKG Interpretation Date/Time:  Wednesday September 20 2023 09:51:20 EDT Ventricular Rate:  62 PR Interval:  170 QRS Duration:  132 QT Interval:  426 QTC Calculation: 432 R Axis:   -27  Text Interpretation: Normal sinus rhythm Left bundle branch block Abnormal ECG When compared with ECG of 28-Jun-2023 08:04, Nonspecific T wave abnormality no longer evident in Inferior leads Confirmed by Edwyna Backers [phone]) on 09/20/2023 10:17:24 AM     Recent Labs: No results found for requested labs within last 365 days.  Recent Lipid Panel    Component Value Date/Time   CHOL 160 06/03/2021 1108   TRIG 186 (H) 06/03/2021 1108   HDL 68 06/03/2021 1108    CHOLHDL 2.4 06/03/2021 1108   LDLCALC 62 06/03/2021 1108    Physical Exam:    VS:  BP 136/74   Pulse 62   Ht 5' 6 (1.676 m)   Wt 169 lb 9.6 oz (76.9 kg)   SpO2 93%   BMI 27.37 kg/m     Wt Readings from Last 3 Encounters:  09/20/23 169 lb 9.6 oz (76.9 kg)  06/28/23 172 lb 3.2 oz (78.1 kg)  01/24/23 175 lb (79.4 kg)     GEN: Patient is in no acute distress HEENT: Normal NECK: No JVD; No carotid bruits LYMPHATICS: No lymphadenopathy CARDIAC: Hear sounds regular, 2/6 systolic murmur at the apex. RESPIRATORY:  Clear to auscultation without rales, wheezing or rhonchi  ABDOMEN: Soft, non-tender, non-distended MUSCULOSKELETAL:  No edema; No deformity  SKIN: Warm and dry NEUROLOGIC:  Alert and oriented x 3 PSYCHIATRIC:  Normal affect   Signed, Backers JONELLE Edwyna, MD  09/20/2023 10:33 AM    [facility]

## 2023-09-21 ENCOUNTER — Ambulatory Visit: Payer: Self-pay | Admitting: Cardiology

## 2023-09-21 LAB — CBC WITH DIFFERENTIAL/PLATELET
Basophils Absolute: 0 10*3/uL (ref 0.0–0.2)
Basos: 1 %
EOS (ABSOLUTE): 0.2 10*3/uL (ref 0.0–0.4)
Eos: 4 %
Hematocrit: 41.8 % (ref 34.0–46.6)
Hemoglobin: 13.3 g/dL (ref 11.1–15.9)
Immature Grans (Abs): 0 10*3/uL (ref 0.0–0.1)
Immature Granulocytes: 0 %
Lymphocytes Absolute: 1.2 10*3/uL (ref 0.7–3.1)
Lymphs: 19 %
MCH: 29.8 pg (ref 26.6–33.0)
MCHC: 31.8 g/dL (ref 31.5–35.7)
MCV: 94 fL (ref 79–97)
Monocytes Absolute: 0.6 10*3/uL (ref 0.1–0.9)
Monocytes: 10 %
Neutrophils Absolute: 4.2 10*3/uL (ref 1.4–7.0)
Neutrophils: 66 %
Platelets: 192 10*3/uL (ref 150–450)
RBC: 4.46 x10E6/uL (ref 3.77–5.28)
RDW: 13 % (ref 11.7–15.4)
WBC: 6.3 10*3/uL (ref 3.4–10.8)

## 2023-09-21 LAB — BASIC METABOLIC PANEL WITH GFR
BUN/Creatinine Ratio: 14 (ref 12–28)
BUN: 12 mg/dL (ref 8–27)
CO2: 22 mmol/L (ref 20–29)
Calcium: 9.2 mg/dL (ref 8.7–10.3)
Chloride: 98 mmol/L (ref 96–106)
Creatinine, Ser: 0.85 mg/dL (ref 0.57–1.00)
Glucose: 94 mg/dL (ref 70–99)
Potassium: 4.1 mmol/L (ref 3.5–5.2)
Sodium: 135 mmol/L (ref 134–144)
eGFR: 72 mL/min/{1.73_m2} (ref >59)

## 2023-09-22 DIAGNOSIS — Z961 Presence of intraocular lens: Secondary | ICD-10-CM | POA: Diagnosis not present

## 2023-09-22 DIAGNOSIS — H524 Presbyopia: Secondary | ICD-10-CM | POA: Diagnosis not present

## 2023-09-22 DIAGNOSIS — H18513 Endothelial corneal dystrophy, bilateral: Secondary | ICD-10-CM | POA: Diagnosis not present

## 2023-09-22 DIAGNOSIS — H35032 Hypertensive retinopathy, left eye: Secondary | ICD-10-CM | POA: Diagnosis not present

## 2023-09-22 DIAGNOSIS — H52223 Regular astigmatism, bilateral: Secondary | ICD-10-CM | POA: Diagnosis not present

## 2023-09-22 DIAGNOSIS — H26492 Other secondary cataract, left eye: Secondary | ICD-10-CM | POA: Diagnosis not present

## 2023-09-22 DIAGNOSIS — H43812 Vitreous degeneration, left eye: Secondary | ICD-10-CM | POA: Diagnosis not present

## 2023-09-25 DIAGNOSIS — M542 Cervicalgia: Secondary | ICD-10-CM | POA: Diagnosis not present

## 2023-09-25 DIAGNOSIS — E7800 Pure hypercholesterolemia, unspecified: Secondary | ICD-10-CM | POA: Diagnosis not present

## 2023-09-25 DIAGNOSIS — I700 Atherosclerosis of aorta: Secondary | ICD-10-CM | POA: Diagnosis not present

## 2023-09-25 DIAGNOSIS — Z79899 Other long term (current) drug therapy: Secondary | ICD-10-CM | POA: Diagnosis not present

## 2023-09-25 DIAGNOSIS — I10 Essential (primary) hypertension: Secondary | ICD-10-CM | POA: Diagnosis not present

## 2023-09-25 DIAGNOSIS — M810 Age-related osteoporosis without current pathological fracture: Secondary | ICD-10-CM | POA: Diagnosis not present

## 2023-09-25 DIAGNOSIS — K219 Gastro-esophageal reflux disease without esophagitis: Secondary | ICD-10-CM | POA: Diagnosis not present

## 2023-10-02 ENCOUNTER — Telehealth: Payer: Self-pay | Admitting: *Deleted

## 2023-10-02 NOTE — Telephone Encounter (Addendum)
 Cardiac Catheterization scheduled at [facility] for: Tuesday October 03, 2023 10:30 AM Arrival time [facility] Main Entrance A at: 8:30 AM  Nothing to eat after midnight prior to procedure, clear liquids until 5 AM day of procedure.  Medication instructions: -Usual morning medications can be taken with sips of water including aspirin  81 mg.  Plan to go home the same day, you will only stay overnight if medically necessary.  You must have responsible adult to drive you home.  Someone must be with you the first 24 hours after you arrive home.  Reviewed procedure instructions with patient  Patient tells me she is hoarse today, feels related to being out in the wind at the beach. Patient states she is afebrile, generally feels okay, no other signs/symptoms of infection.

## 2023-10-03 ENCOUNTER — Encounter (HOSPITAL_COMMUNITY)
Admission: RE | Disposition: A | Discharge status: Home / Self Care | Payer: Self-pay | Source: Home / Self Care | Attending: Cardiology

## 2023-10-03 ENCOUNTER — Ambulatory Visit (HOSPITAL_COMMUNITY)
Admission: RE | Admit: 2023-10-03 | Discharge: 2023-10-04 | Disposition: A | Discharge status: Home / Self Care | Attending: Cardiology | Admitting: Cardiology

## 2023-10-03 ENCOUNTER — Encounter (HOSPITAL_COMMUNITY): Payer: Self-pay | Admitting: Cardiology

## 2023-10-03 ENCOUNTER — Other Ambulatory Visit: Payer: Self-pay

## 2023-10-03 DIAGNOSIS — I2510 Atherosclerotic heart disease of native coronary artery without angina pectoris: Secondary | ICD-10-CM | POA: Diagnosis not present

## 2023-10-03 DIAGNOSIS — Z79899 Other long term (current) drug therapy: Secondary | ICD-10-CM | POA: Insufficient documentation

## 2023-10-03 DIAGNOSIS — Z7902 Long term (current) use of antithrombotics/antiplatelets: Secondary | ICD-10-CM | POA: Diagnosis not present

## 2023-10-03 DIAGNOSIS — Z9861 Coronary angioplasty status: Secondary | ICD-10-CM

## 2023-10-03 DIAGNOSIS — J431 Panlobular emphysema: Secondary | ICD-10-CM | POA: Insufficient documentation

## 2023-10-03 DIAGNOSIS — E782 Mixed hyperlipidemia: Secondary | ICD-10-CM | POA: Diagnosis not present

## 2023-10-03 DIAGNOSIS — I25119 Atherosclerotic heart disease of native coronary artery with unspecified angina pectoris: Secondary | ICD-10-CM | POA: Insufficient documentation

## 2023-10-03 DIAGNOSIS — Z7982 Long term (current) use of aspirin: Secondary | ICD-10-CM | POA: Diagnosis not present

## 2023-10-03 DIAGNOSIS — I10 Essential (primary) hypertension: Secondary | ICD-10-CM | POA: Diagnosis present

## 2023-10-03 DIAGNOSIS — F17210 Nicotine dependence, cigarettes, uncomplicated: Secondary | ICD-10-CM | POA: Diagnosis not present

## 2023-10-03 DIAGNOSIS — I209 Angina pectoris, unspecified: Secondary | ICD-10-CM

## 2023-10-03 HISTORY — PX: CORONARY STENT INTERVENTION: CATH118234

## 2023-10-03 HISTORY — DX: Coronary angioplasty status: Z98.61

## 2023-10-03 HISTORY — PX: LEFT HEART CATH AND CORONARY ANGIOGRAPHY: CATH118249

## 2023-10-03 LAB — POCT ACTIVATED CLOTTING TIME
Activated Clotting Time: 268 s
Activated Clotting Time: 308 s

## 2023-10-03 MED ORDER — HEPARIN (PORCINE) IN NACL 1000-0.9 UT/500ML-% IV SOLN
INTRAVENOUS | Status: DC | PRN
  Administered 2023-10-03: 1000 mL

## 2023-10-03 MED ORDER — HYDRALAZINE HCL 20 MG/ML IJ SOLN
INTRAMUSCULAR | Status: AC
  Filled 2023-10-03: qty 1

## 2023-10-03 MED ORDER — CITALOPRAM HYDROBROMIDE 10 MG PO TABS
10.0000 mg | ORAL_TABLET | Freq: Every day | ORAL | Status: DC
  Administered 2023-10-04: 10 mg via ORAL
  Filled 2023-10-03: qty 1

## 2023-10-03 MED ORDER — NITROGLYCERIN 0.4 MG SL SUBL: 0.4000 mg | SUBLINGUAL_TABLET | SUBLINGUAL | Status: DC | PRN

## 2023-10-03 MED ORDER — MIDAZOLAM HCL 2 MG/2ML IJ SOLN
INTRAMUSCULAR | Status: AC
  Filled 2023-10-03: qty 2

## 2023-10-03 MED ORDER — FENTANYL CITRATE (PF) 100 MCG/2ML IJ SOLN
INTRAMUSCULAR | Status: AC
  Filled 2023-10-03: qty 2

## 2023-10-03 MED ORDER — CLOPIDOGREL BISULFATE 300 MG PO TABS
ORAL_TABLET | ORAL | Status: AC
  Filled 2023-10-03: qty 1

## 2023-10-03 MED ORDER — SODIUM CHLORIDE 0.9 % WEIGHT BASED INFUSION: 3.0000 mL/kg/h | INTRAVENOUS | Status: DC

## 2023-10-03 MED ORDER — LOSARTAN POTASSIUM 50 MG PO TABS
50.0000 mg | ORAL_TABLET | Freq: Every day | ORAL | Status: DC
  Administered 2023-10-04: 50 mg via ORAL
  Filled 2023-10-03: qty 1

## 2023-10-03 MED ORDER — ONDANSETRON HCL 4 MG/2ML IJ SOLN
INTRAMUSCULAR | Status: AC
  Filled 2023-10-03: qty 2

## 2023-10-03 MED ORDER — IOHEXOL 350 MG/ML SOLN
INTRAVENOUS | Status: DC | PRN
Start: 2023-10-03 — End: 2023-10-03
  Administered 2023-10-03: 175 mL

## 2023-10-03 MED ORDER — ALBUTEROL SULFATE (2.5 MG/3ML) 0.083% IN NEBU: 2.5000 mg | INHALATION_SOLUTION | Freq: Four times a day (QID) | RESPIRATORY_TRACT | Status: DC | PRN

## 2023-10-03 MED ORDER — ISOSORBIDE MONONITRATE ER 60 MG PO TB24
60.0000 mg | ORAL_TABLET | Freq: Every day | ORAL | Status: DC
  Administered 2023-10-03 – 2023-10-04 (×2): 60 mg via ORAL
  Filled 2023-10-03 (×2): qty 1

## 2023-10-03 MED ORDER — LIDOCAINE HCL (PF) 1 % IJ SOLN
INTRAMUSCULAR | Status: AC
  Filled 2023-10-03: qty 30

## 2023-10-03 MED ORDER — HYDRALAZINE HCL 20 MG/ML IJ SOLN
INTRAMUSCULAR | Status: DC | PRN
  Administered 2023-10-03: 10 mg via INTRAVENOUS

## 2023-10-03 MED ORDER — LIDOCAINE HCL (PF) 1 % IJ SOLN
INTRAMUSCULAR | Status: DC | PRN
Start: 2023-10-03 — End: 2023-10-03
  Administered 2023-10-03: 2 mL

## 2023-10-03 MED ORDER — FENTANYL CITRATE (PF) 100 MCG/2ML IJ SOLN
INTRAMUSCULAR | Status: DC | PRN
  Administered 2023-10-03 (×2): 25 ug via INTRAVENOUS
  Administered 2023-10-03: 50 ug via INTRAVENOUS

## 2023-10-03 MED ORDER — NITROGLYCERIN 1 MG/10 ML FOR IR/CATH LAB
INTRA_ARTERIAL | Status: AC
  Filled 2023-10-03: qty 10

## 2023-10-03 MED ORDER — HEPARIN SODIUM (PORCINE) 1000 UNIT/ML IJ SOLN
INTRAMUSCULAR | Status: DC | PRN
  Administered 2023-10-03: 6000 [IU] via INTRAVENOUS
  Administered 2023-10-03: 5000 [IU] via INTRAVENOUS
  Administered 2023-10-03: 3000 [IU] via INTRAVENOUS

## 2023-10-03 MED ORDER — SODIUM CHLORIDE 0.9 % WEIGHT BASED INFUSION: 1.0000 mL/kg/h | INTRAVENOUS | Status: DC

## 2023-10-03 MED ORDER — MIDAZOLAM HCL 2 MG/2ML IJ SOLN
INTRAMUSCULAR | Status: DC | PRN
  Administered 2023-10-03: 2 mg via INTRAVENOUS

## 2023-10-03 MED ORDER — VERAPAMIL HCL 2.5 MG/ML IV SOLN
INTRAVENOUS | Status: DC | PRN
  Administered 2023-10-03: 10 mL via INTRA_ARTERIAL

## 2023-10-03 MED ORDER — ASPIRIN 81 MG PO CHEW: 81.0000 mg | CHEWABLE_TABLET | ORAL | Status: DC

## 2023-10-03 MED ORDER — ASPIRIN 81 MG PO CHEW
81.0000 mg | CHEWABLE_TABLET | Freq: Every day | ORAL | Status: DC
  Administered 2023-10-04: 81 mg via ORAL
  Filled 2023-10-03: qty 1

## 2023-10-03 MED ORDER — ONDANSETRON HCL 4 MG/2ML IJ SOLN
INTRAMUSCULAR | Status: DC | PRN
  Administered 2023-10-03 (×2): 4 mg via INTRAVENOUS

## 2023-10-03 MED ORDER — PANTOPRAZOLE SODIUM 40 MG PO TBEC
40.0000 mg | DELAYED_RELEASE_TABLET | Freq: Every day | ORAL | Status: DC
  Administered 2023-10-04: 40 mg via ORAL
  Filled 2023-10-03: qty 1

## 2023-10-03 MED ORDER — ACETAMINOPHEN 325 MG PO TABS
650.0000 mg | ORAL_TABLET | ORAL | Status: DC | PRN
  Administered 2023-10-03 – 2023-10-04 (×2): 650 mg via ORAL
  Filled 2023-10-03 (×2): qty 2

## 2023-10-03 MED ORDER — HEPARIN (PORCINE) IN NACL 2-0.9 UNITS/ML
INTRAMUSCULAR | Status: DC | PRN
  Administered 2023-10-03: 10 mL via INTRA_ARTERIAL

## 2023-10-03 MED ORDER — METOPROLOL SUCCINATE ER 25 MG PO TB24
25.0000 mg | ORAL_TABLET | Freq: Every day | ORAL | Status: DC
  Administered 2023-10-03 – 2023-10-04 (×2): 25 mg via ORAL
  Filled 2023-10-03 (×2): qty 1

## 2023-10-03 MED ORDER — HEPARIN SODIUM (PORCINE) 1000 UNIT/ML IJ SOLN
INTRAMUSCULAR | Status: AC
  Filled 2023-10-03: qty 10

## 2023-10-03 MED ORDER — ROSUVASTATIN CALCIUM 20 MG PO TABS
40.0000 mg | ORAL_TABLET | Freq: Every day | ORAL | Status: DC
  Administered 2023-10-04: 40 mg via ORAL
  Filled 2023-10-03: qty 2

## 2023-10-03 MED ORDER — NICOTINE 21 MG/24HR TD PT24
21.0000 mg | MEDICATED_PATCH | Freq: Every day | TRANSDERMAL | Status: DC
  Filled 2023-10-03: qty 1

## 2023-10-03 MED ORDER — SODIUM CHLORIDE 0.9 % IV SOLN: INTRAVENOUS | Status: AC

## 2023-10-03 MED ORDER — VERAPAMIL HCL 2.5 MG/ML IV SOLN
INTRAVENOUS | Status: AC
  Filled 2023-10-03: qty 2

## 2023-10-03 MED ORDER — CLOPIDOGREL BISULFATE 300 MG PO TABS
ORAL_TABLET | ORAL | Status: DC | PRN
  Administered 2023-10-03 (×2): 300 mg via ORAL

## 2023-10-03 MED ORDER — CLOPIDOGREL BISULFATE 75 MG PO TABS
75.0000 mg | ORAL_TABLET | Freq: Every day | ORAL | Status: DC
  Administered 2023-10-04: 75 mg via ORAL
  Filled 2023-10-03: qty 1

## 2023-10-03 NOTE — Interval H&P Note (Signed)
 History and Physical Interval Note:  10/03/2023 12:08 PM  Laurie Barr  has presented today for surgery, with the diagnosis of angina.  The various methods of treatment have been discussed with the patient and family. After consideration of risks, benefits and other options for treatment, the patient has consented to  Procedure(s): LEFT HEART CATH AND CORONARY ANGIOGRAPHY (N/A) and possible coronary angioplasty as a surgical intervention.  The patient's history has been reviewed, patient examined, no change in status, stable for surgery.  I have reviewed the patient's chart and labs.  Questions were answered to the patient's satisfaction.   Cath Lab Visit (complete for each Cath Lab visit)  Clinical Evaluation Leading to the Procedure:   ACS: No.  Non-ACS:    Anginal Classification: CCS II  Anti-ischemic medical therapy: Minimal Therapy (1 class of medications)  Non-Invasive Test Results: No non-invasive testing performed  Prior CABG: No previous CABG   Laurie Barr

## 2023-10-04 ENCOUNTER — Other Ambulatory Visit (HOSPITAL_COMMUNITY): Payer: Self-pay

## 2023-10-04 ENCOUNTER — Telehealth: Payer: Self-pay | Admitting: Cardiology

## 2023-10-04 DIAGNOSIS — I209 Angina pectoris, unspecified: Secondary | ICD-10-CM

## 2023-10-04 DIAGNOSIS — I25119 Atherosclerotic heart disease of native coronary artery with unspecified angina pectoris: Secondary | ICD-10-CM | POA: Diagnosis not present

## 2023-10-04 LAB — BASIC METABOLIC PANEL WITH GFR
Anion gap: 11 (ref 5–15)
BUN: 6 mg/dL — ABNORMAL LOW (ref 8–23)
CO2: 21 mmol/L — ABNORMAL LOW (ref 22–32)
Calcium: 8.4 mg/dL — ABNORMAL LOW (ref 8.9–10.3)
Chloride: 106 mmol/L (ref 98–111)
Creatinine, Ser: 0.68 mg/dL (ref 0.44–1.00)
GFR, Estimated: >60 mL/min (ref >60)
Glucose, Bld: 163 mg/dL — ABNORMAL HIGH (ref 70–99)
Potassium: 3.5 mmol/L (ref 3.5–5.1)
Sodium: 138 mmol/L (ref 135–145)

## 2023-10-04 LAB — LIPID PANEL
Cholesterol: 104 mg/dL (ref 0–200)
HDL: 46 mg/dL (ref >40)
LDL Cholesterol: 36 mg/dL (ref 0–99)
Total CHOL/HDL Ratio: 2.3 ratio
Triglycerides: 109 mg/dL (ref <150)
VLDL: 22 mg/dL (ref 0–40)

## 2023-10-04 LAB — HEMOGLOBIN A1C
Hgb A1c MFr Bld: 6 % — ABNORMAL HIGH (ref 4.8–5.6)
Mean Plasma Glucose: 126 mg/dL

## 2023-10-04 MED ORDER — CLOPIDOGREL BISULFATE 75 MG PO TABS
75.0000 mg | ORAL_TABLET | Freq: Every day | ORAL | 3 refills | Status: DC
  Filled 2023-10-04: qty 90, 90d supply, fill #0

## 2023-10-04 NOTE — Progress Notes (Signed)
 Called pt to talk to her about CRP II program. Pt did not answer. VM was left.

## 2023-10-04 NOTE — TOC CM/SW Note (Signed)
 Transition of Care [facility]) - Inpatient Brief Assessment   Patient Details  Name: RILEI KRAVITZ MRN: [id number] Date of Birth: 1950-05-02  Transition of Care [facility]) CM/SW Contact:    Sudie Erminio Deems, RN Phone Number: 10/04/2023, 11:34 AM   Clinical Narrative: Patient presented for CAD-post cath-Plavix . PTA Patient was independent from home with family support. Patient states she has a PCP and gets to appointments without any issues. Daughter will provide transportation home today. No further needs identified at this time.    Transition of Care Asessment: Insurance and Status: Insurance coverage has been reviewed Patient has primary care physician: Yes Home environment has been reviewed: reviewed Prior level of function:: independent Prior/Current Home Services: No current home services Social Drivers of Health Review: SDOH reviewed no interventions necessary Readmission risk has been reviewed: Yes Transition of care needs: no transition of care needs at this time

## 2023-10-04 NOTE — Plan of Care (Signed)

## 2023-10-04 NOTE — Telephone Encounter (Signed)
   Transition of Care Follow-up Phone Call Request    Patient Name: Laurie Barr Date of Birth: 13-Nov-1950 Date of Encounter: 10/04/2023  Primary Care Provider:  Gayl Males, MD Primary Cardiologist:  Jennifer JONELLE Crape, MD  Laurie Barr has been scheduled for a transition of care follow up appointment with a [facility] provider:  Delon Hoover 7/16  Please reach out to Laurie Barr within 48 hours of discharge to confirm appointment and review transition of care protocol questionnaire. Anticipated discharge date: 7/9  Manuelita Rummer, NP  10/04/2023, 11:11 AM

## 2023-10-04 NOTE — Discharge Summary (Addendum)
 Discharge Summary   Patient ID: Laurie Barr MRN: [id number]; DOB: 01-14-1951  Admit date: 10/03/2023 Discharge date: 10/04/2023  PCP:  Gayl Males, MD   [facility] Providers Cardiologist:  Jennifer JONELLE Crape, MD     Discharge Diagnoses  Principal Problem:   Post PTCA Active Problems:   Mixed dyslipidemia   Cigarette smoker   Essential hypertension  Diagnostic Studies/Procedures   Cardiac Catheterization 10/03/23: Hemodynamic data: LVEDP 8 mmHg.  There is no pressure gradient across the aortic valve.   Angiographic data: LM: Very large caliber vessel, smooth and normal.  Mild calcification is evident. LAD: Very large caliber vessel, gives origin to a very large D1 with the ostial 99% stenosis.  LAD at the diagonal branch has a 70 to 80% stenosis which is eccentric followed by a tandem mid LAD 80% stenosis. LCx: Dominant.  Gives origin to small OM1 moderate-sized OM 2, large OM 3 and a large PDA branch.  It is smooth and normal. RCA: Nondominant.   Intervention data: Successful but complex bifurcation stenting, culotte technique was utilized to implant a 2.5 x 16 mm Synergy XD DES in the ostium of D1 and 2 overlapping stents in the proximal and mid LAD that is a 3.0 x 32 mm and a 3.0 x 16 mm Synergy XD DES, both stents were dilated at 16 atmospheric pressure and kissing balloon angioplasty was performed for completing the procedure.  Overall stenosis reduced from 99% to 0% in the and 80% to 0% in proximal and mid LAD with TIMI-3 TIMI-3 flow.      Impression and recommendations: Will be discharged home tomorrow morning on aspirin  indefinitely and Plavix  for at least a period of 6 months.  Consider continuing Plavix  indefinitely and discontinuing aspirin . _____________   History of Present Illness   Laurie Barr is a 73 y.o. female with past medical history of hypertension, tobacco use who presented to the office 6/25 with complaints of exertional dyspnea  and angina.  She had initially been planned for an outpatient CT but was unable to stop therefore this was not completed.  When seen back in the office she continued to complain of any anginal symptoms and was set up for outpatient cardiac catheterization.   Hospital Course    CAD -- Underwent outpatient cardiac catheterization 7/8 with successful, complex bifurcation stenting of LAD and ostium of D1 with overlapping stents in the LAD and DES x 1 to D1.  Recommendations for DAPT with aspirin /Plavix  for at least 6 months and then Plavix  indefinitely. -- no chest pain overnight -- seen by CR -- continue ASA, plavix , losartan  and Crestor    HLD -- LDL 36, HDL 46 -- continue Crestor  40mg  daily   HTN -- controlled -- continue losartan  50mg  daily  Tobacco use -- she is not interested in quitting  Patient was seen by myself and Dr. Wonda and deemed stable for discharge home. Follow up arranged in the office. Medications sent to the [facility] pharmacy. Educated by PharmD prior to DC.   Did the patient have an acute coronary syndrome (MI, NSTEMI, STEMI, etc) this admission?:  No                               Did the patient have a percutaneous coronary intervention (stent / angioplasty)?:  Yes.     Cath/PCI Registry Performance & Quality Measures: Aspirin  prescribed? - Yes ADP Receptor Inhibitor (Plavix /Clopidogrel , Brilinta/Ticagrelor or Effient/Prasugrel)  prescribed (includes medically managed patients)? - Yes High Intensity Statin (Lipitor 40-80mg  or Crestor  20-40mg ) prescribed? - Yes For EF <40%, was ACEI/ARB prescribed? - Not Applicable (EF >/= 40%) For EF <40%, Aldosterone Antagonist (Spironolactone or Eplerenone) prescribed? - Not Applicable (EF >/= 40%) Cardiac Rehab Phase II ordered? - Yes       The patient will be scheduled for a TOC follow up appointment in 10-14 days.  A message has been sent to the [facility] and Scheduling Pool at the office where the patient should be seen for  follow up.  _____________  Discharge Vitals Blood pressure 128/70, pulse 74, temperature 98.4 F (36.9 C), temperature source Oral, resp. rate 18, height 5' 6 (1.676 m), weight 77.7 kg, SpO2 (!) 88%.  Filed Weights   10/03/23 0837 10/03/23 1737  Weight: 73 kg 77.7 kg    Labs & Radiologic Studies  CBC No results for input(s): WBC, NEUTROABS, HGB, HCT, MCV, PLT in the last 72 hours. Basic Metabolic Panel Recent Labs    92/90/74 0845  NA 138  K 3.5  CL 106  CO2 21*  GLUCOSE 163*  BUN 6*  CREATININE 0.68  CALCIUM  8.4*   Liver Function Tests No results for input(s): AST, ALT, ALKPHOS, BILITOT, PROT, ALBUMIN in the last 72 hours. No results for input(s): LIPASE, AMYLASE in the last 72 hours. High Sensitivity Troponin:   No results for input(s): TROPONINIHS in the last 720 hours.  No results for input(s): TRNPT in the last 720 hours.  BNP Invalid input(s): POCBNP No results for input(s): PROBNP in the last 72 hours.  No results for input(s): BNP in the last 72 hours.  D-Dimer No results for input(s): DDIMER in the last 72 hours. Hemoglobin A1C No results for input(s): HGBA1C in the last 72 hours. Fasting Lipid Panel Recent Labs    10/04/23 0845  CHOL 104  HDL 46  LDLCALC 36  TRIG 109  CHOLHDL 2.3   No results found for: LIPOA  Thyroid  Function Tests No results for input(s): TSH, T4TOTAL, T3FREE, THYROIDAB in the last 72 hours.  Invalid input(s): FREET3 _____________  CARDIAC CATHETERIZATION Result Date: 10/03/2023 Images from the original result were not included. Cardiac Catheterization 10/03/23: Hemodynamic data: LVEDP 8 mmHg.  There is no pressure gradient across the aortic valve. Angiographic data: LM: Very large caliber vessel, smooth and normal.  Mild calcification is evident. LAD: Very large caliber vessel, gives origin to a very large D1 with the ostial 99% stenosis.  LAD at the diagonal branch has a 70  to 80% stenosis which is eccentric followed by a tandem mid LAD 80% stenosis. LCx: Dominant.  Gives origin to small OM1 moderate-sized OM 2, large OM 3 and a large PDA branch.  It is smooth and normal. RCA: Nondominant. Intervention data: Successful but complex bifurcation stenting, culotte technique was utilized to implant a 2.5 x 16 mm Synergy XD DES in the ostium of D1 and 2 overlapping stents in the proximal and mid LAD that is a 3.0 x 32 mm and a 3.0 x 16 mm Synergy XD DES, both stents were dilated at 16 atmospheric pressure and kissing balloon angioplasty was performed for completing the procedure.  Overall stenosis reduced from 99% to 0% in the and 80% to 0% in proximal and mid LAD with TIMI-3 TIMI-3 flow. Impression and recommendations: Will be discharged home tomorrow morning on aspirin  indefinitely and Plavix  for at least a period of 6 months.  Consider continuing Plavix  indefinitely and  discontinuing aspirin .    Disposition Pt is being discharged home today in good condition.  Follow-up Plans & Appointments  Discharge Instructions     AMB Referral to Cardiac Rehabilitation - Phase II   Complete by: As directed    [facility]   Diagnosis:  Stable Angina Coronary Stents     After initial evaluation and assessments completed: Virtual Based Care may be provided alone or in conjunction with Phase 2 Cardiac Rehab based on patient barriers.: Yes   Intensive Cardiac Rehabilitation (ICR) [facility] location only OR Traditional Cardiac Rehabilitation (TCR) *If criteria for ICR are not met will enroll in TCR [facility] only): Yes   Call MD for:  difficulty breathing, headache or visual disturbances   Complete by: As directed    Call MD for:  persistant dizziness or light-headedness   Complete by: As directed    Call MD for:  redness, tenderness, or signs of infection (pain, swelling, redness, odor or green/yellow discharge around incision site)   Complete by: As directed    Diet - low sodium heart  healthy   Complete by: As directed    Discharge instructions   Complete by: As directed    Radial Site Care Refer to this sheet in the next few weeks. These instructions provide you with information on caring for yourself after your procedure. Your caregiver may also give you more specific instructions. Your treatment has been planned according to current medical practices, but problems sometimes occur. Call your caregiver if you have any problems or questions after your procedure. HOME CARE INSTRUCTIONS You may shower the day after the procedure. Remove the bandage (dressing) and gently wash the site with plain soap and water. Gently pat the site dry.  Do not apply powder or lotion to the site.  Do not submerge the affected site in water for 3 to 5 days.  Inspect the site at least twice daily.  Do not flex or bend the affected arm for 24 hours.  No lifting over 5 pounds (2.3 kg) for 5 days after your procedure.  Do not drive home if you are discharged the same day of the procedure. Have someone else drive you.  You may drive 24 hours after the procedure unless otherwise instructed by your caregiver.  What to expect: Any bruising will usually fade within 1 to 2 weeks.  Blood that collects in the tissue (hematoma) may be painful to the touch. It should usually decrease in size and tenderness within 1 to 2 weeks.  SEEK IMMEDIATE MEDICAL CARE IF: You have unusual pain at the radial site.  You have redness, warmth, swelling, or pain at the radial site.  You have drainage (other than a small amount of blood on the dressing).  You have chills.  You have a fever or persistent symptoms for more than 72 hours.  You have a fever and your symptoms suddenly get worse.  Your arm becomes pale, cool, tingly, or numb.  You have heavy bleeding from the site. Hold pressure on the site.   PLEASE DO NOT MISS ANY DOSES OF YOUR PLAVIX !!!!! Also keep a log of you blood pressures and bring back to your follow up  appt. Please call the office with any questions.   Patients taking blood thinners should generally stay away from medicines like ibuprofen, Advil, Motrin, naproxen, and Aleve due to risk of stomach bleeding. You may take Tylenol  as directed or talk to your primary doctor about alternatives.   PLEASE ENSURE THAT  YOU DO NOT RUN OUT OF YOUR PLAVIX . This medication is very important to remain on for at least 6months. IF you have issues obtaining this medication due to cost please CALL the office 3-5 business days prior to running out in order to prevent missing doses of this medication.   Increase activity slowly   Complete by: As directed        Discharge Medications Allergies as of 10/04/2023       Reactions   Latex Rash        Medication List     TAKE these medications    albuterol  108 (90 Base) MCG/ACT inhaler Commonly known as: VENTOLIN  HFA Inhale 2 puffs into the lungs every 6 (six) hours as needed for wheezing or shortness of breath.   aspirin  81 MG tablet Take 81 mg by mouth daily.   baclofen 10 MG tablet Commonly known as: LIORESAL Take 10 mg by mouth 2 (two) times daily as needed for muscle spasms.   citalopram  20 MG tablet Commonly known as: CELEXA  Take 10 mg by mouth daily.   clopidogrel  75 MG tablet Commonly known as: PLAVIX  Take 1 tablet (75 mg total) by mouth daily with breakfast. Start taking on: October 05, 2023   cyanocobalamin 1000 MCG tablet Commonly known as: VITAMIN B12 Take 1,000 mcg by mouth daily.   dicyclomine  10 MG capsule Commonly known as: BENTYL  TAKE 1 CAPSULE(10 MG) BY MOUTH TWICE DAILY   fexofenadine 180 MG tablet Commonly known as: ALLEGRA Take 180 mg by mouth daily as needed for allergies or rhinitis.   fluticasone 50 MCG/ACT nasal spray Commonly known as: FLONASE Place 2 sprays into both nostrils 2 (two) times daily.   guaifenesin 100 MG/5ML syrup Commonly known as: ROBITUSSIN Take 200 mg by mouth 3 (three) times daily as  needed for cough.   IMMUNE SUPPORT PO Take 1 tablet by mouth daily.   losartan  50 MG tablet Commonly known as: COZAAR  Take 50 mg by mouth daily.   meloxicam 15 MG tablet Commonly known as: MOBIC Take 15 mg by mouth daily as needed for pain.   montelukast 10 MG tablet Commonly known as: SINGULAIR Take 10 mg by mouth daily.   nitroGLYCERIN  0.4 MG SL tablet Commonly known as: NITROSTAT  Place 1 tablet (0.4 mg total) under the tongue every 5 (five) minutes as needed for chest pain.   pantoprazole  40 MG tablet Commonly known as: PROTONIX  TAKE 1 TABLET(40 MG) BY MOUTH DAILY   PROBIOTIC PO Take 1 tablet by mouth daily.   rosuvastatin  40 MG tablet Commonly known as: CRESTOR  Take 40 mg by mouth daily.   Trelegy Ellipta 100-62.5-25 MCG/ACT Aepb Generic drug: Fluticasone-Umeclidin-Vilant Inhale 1 puff into the lungs daily.         Outstanding Labs/Studies  N/a  Duration of Discharge Encounter: APP Time: 20 minutes   Signed, Manuelita Rummer, NP 10/04/2023, 11:13 AM  Patient seen, examined. Available data reviewed. Agree with findings, assessment, and plan as outlined by Manuelita Rummer, NP.  Interviewed and examined.  She is alert, oriented, in no distress.  HEENT is normal, JVP is normal, lungs are clear bilaterally, heart is regular rate and rhythm with no murmur or gallop, right radial cath site is clear with no hematoma or ecchymosis, lower extremities have no edema.  Cardiac catheterization films are personally reviewed and she had severe bifurcation disease of the LAD and first diagonal, treated successfully with a multi stent bifurcation technique.  The patient is medically stable this morning  with no recurrent ischemic symptoms.  She will remain on aspirin  and clopidogrel  for at least 6 months with recommendations for indefinite clopidogrel  thereafter.  She will continue rosuvastatin  for her hyperlipidemia, losartan  for her hypertension, and she is counseled regarding her  tobacco abuse but is not ready to quit.  MD time spent conducting this discharge is 25 minutes.  This reflects my personal exam of the patient, review of her cardiac catheterization and discussion with the patient, as well as recommendations for medications and discharge counseling.  Ozell Fell, M.D. 10/04/2023 11:53 AM

## 2023-10-05 NOTE — Telephone Encounter (Signed)
 Patient contacted regarding discharge from [facility] on 10/04/23.  Patient understands to follow up with provider With Cardiology Jesusa JAYSON Hoover, NP) 10/11/2023 at 10:55 AM in [place] yes Patient understands discharge instructions? yes Patient understands medications and regiment? yes Patient understands to bring all medications to this visit? yes  Ask patient:  Are you enrolled in My Chart Yes

## 2023-10-06 ENCOUNTER — Telehealth: Payer: Self-pay | Admitting: Cardiology

## 2023-10-06 DIAGNOSIS — I2510 Atherosclerotic heart disease of native coronary artery without angina pectoris: Secondary | ICD-10-CM | POA: Diagnosis not present

## 2023-10-06 DIAGNOSIS — R0602 Shortness of breath: Secondary | ICD-10-CM | POA: Diagnosis not present

## 2023-10-06 DIAGNOSIS — R918 Other nonspecific abnormal finding of lung field: Secondary | ICD-10-CM | POA: Diagnosis not present

## 2023-10-06 DIAGNOSIS — R062 Wheezing: Secondary | ICD-10-CM | POA: Diagnosis not present

## 2023-10-06 MED FILL — Nitroglycerin IV Soln 100 MCG/ML in D5W: INTRA_ARTERIAL | Qty: 10 | Status: AC

## 2023-10-06 NOTE — Telephone Encounter (Signed)
 Call was transferred from the patient call center and the patient reported that she had just had a surgery to have heart stent's placed this past Tuesday in [place]. Today she has become very congested and coughing up phlegm that is tinged with bright and rust colored blood. Patient states that she has lung issues where she has bacteria in her lungs and she has a history of asthma. She states she was told to call the office if she had any bleeding. Spoke to Dr. Krasowski and he recommended that she follow up with her lung doctor, Dr. Mardee. I relayed Dr. Karry recommendation to the patient and she agreed that she felt it was a lung issue. Patient stated that she would follow up with Dr. Mardee.

## 2023-10-06 NOTE — Telephone Encounter (Signed)
 Pt c/o of Chest Pain: STAT if active (IN THIS MOMENT) CP, including tightness, pressure, jaw pain, shoulder/upper arm/back pain, SOB, nausea, and vomiting.  1. Are you having CP right now (tightness, pressure, or discomfort)? Yes, side of her breast   2. Are you experiencing any other symptoms (ex. SOB, nausea, vomiting, sweating)? Sob, she has phlegm coming up and its bloody, nauseated   3. How long have you been experiencing CP? Since last night   4. Is your CP continuous or coming and going? Coming and going  5. Have you taken Nitroglycerin ? No   6. If CP returns before callback, please consider calling 911. ?

## 2023-10-06 NOTE — Telephone Encounter (Signed)
 Call transferred to Charlie, RN

## 2023-10-09 ENCOUNTER — Telehealth (HOSPITAL_COMMUNITY): Payer: Self-pay | Admitting: *Deleted

## 2023-10-09 ENCOUNTER — Other Ambulatory Visit (HOSPITAL_COMMUNITY): Payer: Self-pay | Admitting: *Deleted

## 2023-10-09 DIAGNOSIS — Z955 Presence of coronary angioplasty implant and graft: Secondary | ICD-10-CM

## 2023-10-09 NOTE — Telephone Encounter (Signed)
 Called pt to discuss stents and risk factor modification. No answer, LVM. Placed referral for [facility] and will send education materials in mail, including smoking cessation material. Aliene Aris BS, ACSM-CEP 10/09/2023 2:59 PM

## 2023-10-10 NOTE — Progress Notes (Unsigned)
 Cardiology Office Note   Date:  10/11/2023  ID:  QUANTISHA MARSICANO, DOB [birth year], MRN [id number] PCP: Gayl Males, MD  [facility] Providers Cardiologist:  Lilya Smitherman JONELLE Crape, MD Cardiology APP:  Carlin Delon BROCKS, NP     History of Present Illness Zayli A Sforza is a 73 y.o. female with a past medical history of CAD DES x 3 10/03/2023, SVT, tobacco abuse, hypertension, dyslipidemia.  10/03/2023 left heart cath DES x 3, bifurcation stenting, overlapping in pLAD and mLAD, DES to D1 07/26/2023 monitor average HR 68 bpm, 64 episodes of SVT 05/18/2022 monitor average HR 70 bpm, 45 episodes of SVT 11/23/2021 echo EF 50-55%, grade I DD, mild dilatation of the ascending aorta 38 mm, mild MR 08/12/2019 coronary CTA calcium  score of 71, 70th percentile, FFR negative for hemodynamic significance  [facility] with [facility] in 2021 for the evaluation of DOE at the behest of her PCP, a coronary CTA was arranged at that time revealing a calcium  score 71, 70th percentile but FFR was negative.  Evaluated by Dr. Crape on 09/20/2023 she was bothered by chest pain and DOE, left heart cath was arranged which was completed on 10/03/2023 requiring DES x 3 overlapping in the pLAD to M LAD and DES to the first diagonal. Following this discharge, she presented to [facility] ED on 10/06/2023 with hemoptysis and pedal edema, CXR revealed diffuse reticular opacities which may represent chronic interstitial changes, infection, edema, or mixture thereof, proBNP 3780, she was given lasix  20 mg po x 5 days.   She presents today following her recent cath and the ED visit for heart failure exacerbation.  She is overall feeling okay, her breathing has not been at baseline for some time, she will be seeing her pulmonologist later today.  She is not having further episodes of angina.  Regarding her heart failure exacerbation, she recalls that her legs were swollen however she thought this was secondary to the heat. She  denies chest pain, palpitations, pnd, orthopnea, n, v, dizziness, syncope, edema, weight gain, or early satiety.   ROS: ROS   Studies Reviewed      Cardiac Studies & Procedures   ______________________________________________________________________________________________ CARDIAC CATHETERIZATION  CARDIAC CATHETERIZATION 10/03/2023  Conclusion Images from the original result were not included. Cardiac Catheterization 10/03/23: Hemodynamic data: LVEDP 8 mmHg.  There is no pressure gradient across the aortic valve.  Angiographic data: LM: Very large caliber vessel, smooth and normal.  Mild calcification is evident. LAD: Very large caliber vessel, gives origin to a very large D1 with the ostial 99% stenosis.  LAD at the diagonal branch has a 70 to 80% stenosis which is eccentric followed by a tandem mid LAD 80% stenosis. LCx: Dominant.  Gives origin to small OM1 moderate-sized OM 2, large OM 3 and a large PDA branch.  It is smooth and normal. RCA: Nondominant.  Intervention data: Successful but complex bifurcation stenting, culotte technique was utilized to implant a 2.5 x 16 mm Synergy XD DES in the ostium of D1 and 2 overlapping stents in the proximal and mid LAD that is a 3.0 x 32 mm and a 3.0 x 16 mm Synergy XD DES, both stents were dilated at 16 atmospheric pressure and kissing balloon angioplasty was performed for completing the procedure.  Overall stenosis reduced from 99% to 0% in the and 80% to 0% in proximal and mid LAD with TIMI-3 TIMI-3 flow.    Impression and recommendations: Will be discharged home tomorrow morning on aspirin  indefinitely and  Plavix  for at least a period of 6 months.  Consider continuing Plavix  indefinitely and discontinuing aspirin .  Findings Coronary Findings Diagnostic  Dominance: Left  Left Anterior Descending Prox LAD to Mid LAD lesion is 80% stenosed. Vessel is the culprit lesion. The lesion is type C. Mid LAD lesion is 80% stenosed. Vessel is the  culprit lesion. The lesion is type C.  First Diagonal Branch 1st Diag lesion is 99% stenosed. The lesion is type C and located at the major branch.  Intervention  Prox LAD to Mid LAD lesion Stent Lesion length:  30 mm. A drug-eluting stent was successfully placed using a STENT SYNERGY XD 3.0X32 in the side branch. Maximum pressure: 16 atm. Inflation time: 45 sec. Stent strut is well apposed. Stent overlaps previously placed stent. Post-stent angioplasty was performed. Kissing balloon angioplasty with the same stent balloon at 10 atmospheric pressure and a 2.5 x 12 mm emerge balloon in D1 at 14 atmospheric pressure Large D1 Post-Intervention Lesion Assessment The intervention was successful. Pre-interventional TIMI flow is 3. Post-intervention TIMI flow is 3. No complications occurred at this lesion. There is a 0% residual stenosis post intervention.  Mid LAD lesion Stent Lesion length:  15 mm. CATH VISTA GUIDE 6FR [id number] guide catheter was inserted. Lesion crossed with guidewire using a versaturn. Pre-stent angioplasty was performed using a BALLOON EMERGE MR 2.5X15. Maximum pressure:  14 atm. Inflation time:  30 sec. A drug-eluting stent was successfully placed using a STENT SYNERGY XD 3.0X20. Maximum pressure: 11 atm. Inflation time: 45 sec. Stent strut is well apposed. Post-stent angioplasty was not performed. Post-Intervention Lesion Assessment The intervention was successful. Pre-interventional TIMI flow is 3. Post-intervention TIMI flow is 3. No complications occurred at this lesion. There is a 0% residual stenosis post intervention.  1st Diag lesion Stent Lesion length:  12 mm. Pre-stent angioplasty was performed using a BALLOON EMERGE MR 2.5X15. Maximum pressure:  14 atm. Inflation time:  30 sec.  A second angioplasty balloon was used, using a BALLOON SAPPHIRE 1.25X8. Maximum pressure:  14 atm. Inflation time:  30 sec.  A third angioplasty balloon was used, using a BALLOON EMERGE MR  2.5X12. Maximum pressure:  14 atm.  Inflation time:  45 sec. A drug-eluting stent was successfully placed using a STENT SYNERGY XD 2.50X16. Maximum pressure: 11 atm. Inflation time: 45 sec. Stent strut is well apposed. Post-stent angioplasty was performed. Stent balloon pulled into the LAD keeping the distal edge safe 16 atmospheric pressure for 45 seconds. Post-Intervention Lesion Assessment The intervention was successful. Pre-interventional TIMI flow is 3. Post-intervention TIMI flow is 3. No complications occurred at this lesion. There is a 0% residual stenosis post intervention.   STRESS TESTS  MYOCARDIAL PERFUSION IMAGING 07/14/2020  Interpretation Summary  Nuclear stress EF: 66%.  The left ventricular ejection fraction is hyperdynamic (>65%).  There was no ST segment deviation noted during stress.  This is a low risk study.   ECHOCARDIOGRAM  ECHOCARDIOGRAM COMPLETE 11/23/2021  Narrative ECHOCARDIOGRAM REPORT    Patient Name:   ARAYAH KROUSE Date of Exam: 11/23/2021 Medical Rec #:  [id number]          Height:       66.0 in Accession #:    [id number]         Weight:       172.8 lb Date of Birth:  1950-05-01          BSA:          1.879 m Patient  Age:    71 years           BP:           28/76 mmHg Patient Gender: F                  HR:           57 bpm. Exam Location:    Procedure: 2D Echo, Cardiac Doppler, Color Doppler and Strain Analysis  Indications:    Coronary artery disease involving native coronary artery of native heart without angina pectoris [I25.10 (ICD-10-CM)]  History:        Patient has prior history of Echocardiogram examinations, most recent 08/21/2019. CAD, COPD, Signs/Symptoms:Chest Pain and Dyspnea; Risk Factors:Former Smoker and Dyslipidemia.  Sonographer:    Lynwood Silvas RDCS Referring Phys: CYRUS Shaneil Yazdi [facility]  IMPRESSIONS   1. Left ventricular ejection fraction, by estimation, is 50 to 55%. The left ventricle has low normal  function. The left ventricle has no regional wall motion abnormalities. Left ventricular diastolic parameters are consistent with Grade I diastolic dysfunction (impaired relaxation). 2. There is mild dilatation of the ascending aorta, measuring 38 mm.  FINDINGS Left Ventricle: Left ventricular ejection fraction, by estimation, is 50 to 55%. The left ventricle has low normal function. The left ventricle has no regional wall motion abnormalities. The left ventricular internal cavity size was normal in size. There is no left ventricular hypertrophy. Left ventricular diastolic parameters are consistent with Grade I diastolic dysfunction (impaired relaxation).  Right Ventricle: The right ventricular size is normal. No increase in right ventricular wall thickness. Right ventricular systolic function is normal. There is normal pulmonary artery systolic pressure. The tricuspid regurgitant velocity is 1.95 m/s, and with an assumed right atrial pressure of 3 mmHg, the estimated right ventricular systolic pressure is 18.2 mmHg.  Left Atrium: Left atrial size was normal in size.  Right Atrium: Right atrial size was normal in size.  Pericardium: There is no evidence of pericardial effusion.  Mitral Valve: The mitral valve is normal in structure. Mild mitral valve regurgitation. No evidence of mitral valve stenosis.  Tricuspid Valve: The tricuspid valve is normal in structure. Tricuspid valve regurgitation is not demonstrated. No evidence of tricuspid stenosis.  Aortic Valve: The aortic valve is normal in structure. Aortic valve regurgitation is not visualized. No aortic stenosis is present.  Pulmonic Valve: The pulmonic valve was normal in structure. Pulmonic valve regurgitation is not visualized. No evidence of pulmonic stenosis.  Aorta: The aortic root is normal in size and structure. There is mild dilatation of the ascending aorta, measuring 38 mm.  Venous: The inferior vena cava is normal in size  with greater than 50% respiratory variability, suggesting right atrial pressure of 3 mmHg.  IAS/Shunts: No atrial level shunt detected by color flow Doppler.   LEFT VENTRICLE PLAX 2D LVIDd:         4.30 cm     Diastology LVIDs:         3.30 cm     LV e' medial:    5.33 cm/s LV PW:         1.10 cm     LV E/e' medial:  13.1 LV IVS:        1.10 cm     LV e' lateral:   6.42 cm/s LVOT diam:     2.00 cm     LV E/e' lateral: 10.9 LV SV:         79 LV SV Index:  42 LVOT Area:     3.14 cm  LV Volumes (MOD) LV vol d, MOD A2C: 64.1 ml LV vol d, MOD A4C: 58.1 ml LV vol s, MOD A2C: 28.3 ml LV vol s, MOD A4C: 28.5 ml LV SV MOD A2C:     35.8 ml LV SV MOD A4C:     58.1 ml LV SV MOD BP:      32.2 ml  RIGHT VENTRICLE            IVC RV S prime:     9.68 cm/s  IVC diam: 1.30 cm TAPSE (M-mode): 2.2 cm  LEFT ATRIUM             Index        RIGHT ATRIUM          Index LA diam:        3.60 cm 1.92 cm/m   RA Area:     9.19 cm LA Vol (A2C):   55.6 ml 29.58 ml/m  RA Volume:   16.20 ml 8.62 ml/m LA Vol (A4C):   28.5 ml 15.16 ml/m LA Biplane Vol: 39.4 ml 20.96 ml/m AORTIC VALVE LVOT Vmax:   118.00 cm/s LVOT Vmean:  78.300 cm/s LVOT VTI:    0.252 m  AORTA Ao Root diam: 3.50 cm Ao Asc diam:  3.80 cm Ao Desc diam: 2.30 cm  MITRAL VALVE               TRICUSPID VALVE MV Area (PHT): 3.23 cm    TR Peak grad:   15.2 mmHg MV Decel Time: 235 msec    TR Vmax:        195.00 cm/s MV E velocity: 69.80 cm/s MV A velocity: 90.40 cm/s  SHUNTS MV E/A ratio:  0.77        Systemic VTI:  0.25 m Systemic Diam: 2.00 cm  [place] Mullett Crape MD Electronically signed by Avila Albritton Crape MD Signature Date/Time: 11/23/2021/1:10:12 PM    Final    MONITORS  LONG TERM MONITOR (3-14 DAYS) 07/19/2023  Narrative Patch Wear Time:  13 days and 23 hours (2025-04-02T08:35:58-0400 to 2025-04-16T08:35:50-0400)  Patient had a min HR of 43 bpm, max HR of 185 bpm, and avg HR of 68 bpm.  Predominant underlying rhythm  was Sinus Rhythm. Bundle Branch Block/IVCD was present.  64 Supraventricular Tachycardia runs occurred, the run with the fastest interval lasting 8 beats with a max rate of 185 bpm, the longest lasting 17 beats with an avg rate of 99 bpm. Supraventricular Tachycardia was detected within +/- 45 seconds of symptomatic patient event(s). Isolated SVEs were rare (<1.0%), SVE Couplets were rare (<1.0%), and SVE Triplets were rare (<1.0%).  Isolated VEs were rare (<1.0%), and no VE Couplets or VE Triplets were present.   CT SCANS  CT CORONARY FRACTIONAL FLOW RESERVE DATA PREP 09/18/2019  Narrative EXAM: CT FFR ANALYSIS  CLINICAL DATA:  Chest pain  FINDINGS: FFRct analysis was performed on the original cardiac CT angiogram dataset. Diagrammatic representation of the FFRct analysis is provided in a separate PDF document in PACS. This dictation was created using the PDF document and an interactive 3D model of the results. 3D model is not available in the EMR/PACS. Normal FFR range is >0.80.  1. Left Main: 0.99; no significant stenosis.  2. Mid LAD: 0.82; no significant stenosis. 3. 1st Diagonal: 0.94; no significant stenosis. 4. LCX: 0.96; no significant stenosis. 5. RCA: 0.98; no significant stenosis.  IMPRESSION: 1.  CT FFR analysis didn't show  any significant stenosis.  Darryle Decent, MD   Electronically Signed By: Darryle Decent On: 09/19/2019 07:26   CT CORONARY MORPH W/CTA COR W/SCORE 09/18/2019  Addendum 09/18/2019  6:58 PM ADDENDUM REPORT: 09/18/2019 18:56  CLINICAL DATA:  Chest pain  EXAM: Cardiac/Coronary CTA  TECHNIQUE: The patient was scanned on a [employer]. A 100 kV prospective scan was triggered in the descending thoracic aorta at 111 HU's. Axial non-contrast 3 mm slices were carried out through the heart. The data set was analyzed on a dedicated work station and scored using the Agatson method. Gantry rotation speed was 250 msecs and  collimation was .6 mm. No beta blockade and 0.8 mg of sl NTG was given. The 3D data set was reconstructed in 5% intervals of the 35-75 % of the R-R cycle. Diastolic phases were analyzed on a dedicated work station using MPR, MIP and VRT modes. The patient received 80 cc of contrast.  FINDINGS: Image quality: excellent.  Noise artifact is: Limited.  Coronary Arteries:  Normal coronary origin.  Left dominance.  Left main: The left main is a short, large caliber vessel with a normal take off from the left coronary cusp that bifurcates to form a left anterior descending artery and a left circumflex artery. There is minimal calcified plaque (<25%).  Left anterior descending artery: The proximal LAD contains mild, mixed density plaque (25-49%). The mid LAD contains moderate non-calcified plaque (50-69%). The distal LAD is patent. The first diagonal contains moderate non-calcified plaque (50-69%).  Left circumflex artery: The LCX is dominant and patent with no evidence of plaque or stenosis. OM1 contains minimal non-calcified plaque (<25%). OM2 and OM3 are patent. The LCX terminates as a patent PDA branch.  Right coronary artery: The RCA is non-dominant with normal take off from the right coronary cusp. There is no evidence of plaque or stenosis.  Right Atrium: Right atrial size is within normal limits.  Right Ventricle: The right ventricular cavity is within normal limits.  Left Atrium: Left atrial size is normal in size with no left atrial appendage filling defect.  Left Ventricle: The ventricular cavity size is within normal limits. There are no stigmata of prior infarction. There is no abnormal filling defect.  Pulmonary arteries: Normal in size without proximal filling defect.  Pulmonary veins: Normal pulmonary venous drainage.  Pericardium: Normal thickness with no significant effusion or calcium  present.  Cardiac valves: The aortic valve is trileaflet without  significant calcification. The mitral valve is normal structure without significant calcification.  Aorta: Normal caliber with no significant disease.  Extra-cardiac findings: See attached radiology report for non-cardiac structures.  IMPRESSION: 1. Coronary calcium  score of 71. This was 70th percentile for age and sex matched controls.  2. Normal coronary origin with left dominance.  3. Moderate stenoses in the mid LAD and first diagonal (50-69%).  RECOMMENDATIONS: 1. Moderate stenosis. Consider symptom-guided anti-ischemic pharmacotherapy as well as risk factor modification per guideline directed care. Additional analysis with CT FFR will be submitted.  Darryle Decent, MD   Electronically Signed By: Darryle Decent On: 09/18/2019 18:56  Narrative EXAM: OVER-READ INTERPRETATION  CT CHEST  The following report is an over-read performed by radiologist Dr. Toribio Aye of [facility] on 09/18/2019. This over-read does not include interpretation of cardiac or coronary anatomy or pathology. The coronary calcium  score/coronary CTA interpretation by the cardiologist is attached.  COMPARISON:  None.  FINDINGS: Aortic atherosclerosis. Widespread areas of cylindrical bronchiectasis, many of which have profound thickening of  the peribronchovascular interstitium as well as some clustered peribronchovascular micro nodularity. Within the visualized portions of the thorax there are no suspicious appearing pulmonary nodules or masses, there is no acute consolidative airspace disease, no pleural effusions, no pneumothorax and no lymphadenopathy. Visualized portions of the upper abdomen demonstrates a 1.2 cm intermediate attenuation lesion in segment 4A of the liver, likely to represent a small proteinaceous cyst. There are no aggressive appearing lytic or blastic lesions noted in the visualized portions of the skeleton.  IMPRESSION: 1.  Aortic Atherosclerosis  (ICD10-I70.0). 2. 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  Electronically Signed: By: Toribio Aye M.D. On: 09/18/2019 12:33     ______________________________________________________________________________________________      Risk Assessment/Calculations           Physical Exam VS:  BP 114/70   Pulse 66   Ht 5' 6 (1.676 m)   Wt 168 lb (76.2 kg)   SpO2 92%   BMI 27.12 kg/m        Wt Readings from Last 3 Encounters:  10/11/23 168 lb (76.2 kg)  10/03/23 171 lb 6.4 oz (77.7 kg)  09/20/23 169 lb 9.6 oz (76.9 kg)    GEN: Well nourished, well developed in no acute distress NECK: No JVD; No carotid bruits CARDIAC: RRR, no murmurs, rubs, gallops RESPIRATORY:  Clear to auscultation without rales, wheezing or rhonchi  ABDOMEN: Soft, non-tender, non-distended EXTREMITIES:  No edema; No deformity   ASSESSMENT AND PLAN CAD -s/p DES x 3 as outlined above in HPI.Stable with no anginal symptoms. No indication for ischemic evaluation.  Continue aspirin  81 mg daily and Plavix  75 mg daily for at least 6 months and the recommendations are to transition to monotherapy with Plavix  alone.  HFpEF-recent ED visit for volume overload, proBNP was elevated greater than 3700 chest x-ray revealed pulmonary edema and she was diuresed with Lasix  with good response.  Will repeat echocardiogram.  Further GDMT after we receive blood work today.  Will check BMET after diuresing.  For now, continue losartan  50 mg daily, Lasix  20 mg daily.  Dyslipidemia-most recent LDL was well-controlled at 36, continue Crestor  40 mg daily.  Will check LP(a) and ApoB at her next office visit.  Hypertension -blood pressure is well-controlled at 114/70, continue losartan  50 mg daily.  Tobacco abuse with COPD-not interested in cessation.  Follows with local pulmonologist and she will see him today at  230.        Dispo: BMET today, echocardiogram, follow-up with Dr. Revankar in 1 month.  Signed, Delon JAYSON Hoover, NP

## 2023-10-11 ENCOUNTER — Encounter: Payer: Self-pay | Admitting: Cardiology

## 2023-10-11 ENCOUNTER — Ambulatory Visit: Attending: Cardiology | Admitting: Cardiology

## 2023-10-11 VITALS — BP 114/70 | HR 66 | Ht 66.0 in | Wt 168.0 lb

## 2023-10-11 DIAGNOSIS — I5030 Unspecified diastolic (congestive) heart failure: Secondary | ICD-10-CM | POA: Diagnosis not present

## 2023-10-11 DIAGNOSIS — I2510 Atherosclerotic heart disease of native coronary artery without angina pectoris: Secondary | ICD-10-CM | POA: Diagnosis not present

## 2023-10-11 DIAGNOSIS — Z720 Tobacco use: Secondary | ICD-10-CM

## 2023-10-11 DIAGNOSIS — J189 Pneumonia, unspecified organism: Secondary | ICD-10-CM | POA: Diagnosis not present

## 2023-10-11 DIAGNOSIS — J4540 Moderate persistent asthma, uncomplicated: Secondary | ICD-10-CM | POA: Diagnosis not present

## 2023-10-11 DIAGNOSIS — J479 Bronchiectasis, uncomplicated: Secondary | ICD-10-CM | POA: Diagnosis not present

## 2023-10-11 DIAGNOSIS — I10 Essential (primary) hypertension: Secondary | ICD-10-CM | POA: Diagnosis not present

## 2023-10-11 DIAGNOSIS — J301 Allergic rhinitis due to pollen: Secondary | ICD-10-CM | POA: Diagnosis not present

## 2023-10-11 DIAGNOSIS — F17210 Nicotine dependence, cigarettes, uncomplicated: Secondary | ICD-10-CM | POA: Diagnosis not present

## 2023-10-11 DIAGNOSIS — E782 Mixed hyperlipidemia: Secondary | ICD-10-CM

## 2023-10-11 MED ORDER — FUROSEMIDE 20 MG PO TABS: 20.0000 mg | ORAL_TABLET | Freq: Every day | ORAL | 3 refills | Status: AC

## 2023-10-11 NOTE — Patient Instructions (Signed)
 Medication Instructions:   *If you need a refill on your cardiac medications before your next appointment, please call your pharmacy*  Lab Work:  BMET   If you have labs (blood work) drawn today and your tests are completely normal, you will receive your results only by: MyChart Message (if you have MyChart) OR A paper copy in the mail If you have any lab test that is abnormal or we need to change your treatment, we will call you to review the results.  Testing/Procedures: Echo   Your physician has requested that you have an echocardiogram. Echocardiography is a painless test that uses sound waves to create images of your heart. It provides your doctor with information about the size and shape of your heart and how well your heart's chambers and valves are working. This procedure takes approximately one hour. There are no restrictions for this procedure. Please do NOT wear cologne, perfume, aftershave, or lotions (deodorant is allowed). Please arrive 15 minutes prior to your appointment time.  Please note: We ask at that you not bring children with you during ultrasound (echo/ vascular) testing. Due to room size and safety concerns, children are not allowed in the ultrasound rooms during exams. Our front office staff cannot provide observation of children in our lobby area while testing is being conducted. An adult accompanying a patient to their appointment will only be allowed in the ultrasound room at the discretion of the ultrasound technician under special circumstances. We apologize for any inconvenience.   Follow-Up: At [facility], you and your health needs are our priority.  As part of our continuing mission to provide you with exceptional heart care, our providers are all part of one team.  This team includes your primary Cardiologist (physician) and Advanced Practice Providers or APPs (Physician Assistants and Nurse Practitioners) who all work together to provide you with  the care you need, when you need it.  Your next appointment:   1 month(s)  Provider:   Ioane Bhola Crape, MD    We recommend signing up for the patient portal called MyChart.  Sign up information is provided on this After Visit Summary.  MyChart is used to connect with patients for Virtual Visits (Telemedicine).  Patients are able to view lab/test results, encounter notes, upcoming appointments, etc.  Non-urgent messages can be sent to your provider as well.   To learn more about what you can do with MyChart, go to [email or web address].   Other Instructions

## 2023-10-12 LAB — BASIC METABOLIC PANEL WITH GFR
BUN/Creatinine Ratio: 12 (ref 12–28)
BUN: 9 mg/dL (ref 8–27)
CO2: 21 mmol/L (ref 20–29)
Calcium: 8.8 mg/dL (ref 8.7–10.3)
Chloride: 99 mmol/L (ref 96–106)
Creatinine, Ser: 0.73 mg/dL (ref 0.57–1.00)
Glucose: 94 mg/dL (ref 70–99)
Potassium: 4.2 mmol/L (ref 3.5–5.2)
Sodium: 136 mmol/L (ref 134–144)
eGFR: 87 mL/min/1.73

## 2023-10-13 ENCOUNTER — Telehealth (HOSPITAL_COMMUNITY): Payer: Self-pay

## 2023-10-13 NOTE — Telephone Encounter (Signed)
 Faxed outside referral for Phase II Cardiac Rehab to [facility].

## 2023-10-18 ENCOUNTER — Telehealth: Payer: Self-pay | Admitting: Cardiology

## 2023-10-18 NOTE — Telephone Encounter (Signed)
 Laurie Barr with [facility] (pt PCP) asked for most lab results to be faxed to them   Fax: [phone]

## 2023-10-18 NOTE — Telephone Encounter (Signed)
 Labs sent to [facility].

## 2023-10-20 ENCOUNTER — Ambulatory Visit: Payer: Self-pay | Admitting: Cardiology

## 2023-10-24 DIAGNOSIS — F17210 Nicotine dependence, cigarettes, uncomplicated: Secondary | ICD-10-CM | POA: Diagnosis not present

## 2023-10-24 DIAGNOSIS — J189 Pneumonia, unspecified organism: Secondary | ICD-10-CM | POA: Diagnosis not present

## 2023-10-24 DIAGNOSIS — J4540 Moderate persistent asthma, uncomplicated: Secondary | ICD-10-CM | POA: Diagnosis not present

## 2023-10-24 DIAGNOSIS — J479 Bronchiectasis, uncomplicated: Secondary | ICD-10-CM | POA: Diagnosis not present

## 2023-10-24 DIAGNOSIS — J310 Chronic rhinitis: Secondary | ICD-10-CM | POA: Diagnosis not present

## 2023-10-26 DIAGNOSIS — Z79899 Other long term (current) drug therapy: Secondary | ICD-10-CM | POA: Diagnosis not present

## 2023-10-26 DIAGNOSIS — I10 Essential (primary) hypertension: Secondary | ICD-10-CM | POA: Diagnosis not present

## 2023-10-26 DIAGNOSIS — E7800 Pure hypercholesterolemia, unspecified: Secondary | ICD-10-CM | POA: Diagnosis not present

## 2023-10-26 DIAGNOSIS — K219 Gastro-esophageal reflux disease without esophagitis: Secondary | ICD-10-CM | POA: Diagnosis not present

## 2023-10-27 ENCOUNTER — Ambulatory Visit: Attending: Cardiology

## 2023-10-27 DIAGNOSIS — E782 Mixed hyperlipidemia: Secondary | ICD-10-CM

## 2023-10-27 DIAGNOSIS — I2510 Atherosclerotic heart disease of native coronary artery without angina pectoris: Secondary | ICD-10-CM

## 2023-10-27 DIAGNOSIS — I10 Essential (primary) hypertension: Secondary | ICD-10-CM | POA: Diagnosis not present

## 2023-10-29 LAB — ECHOCARDIOGRAM COMPLETE
AR max vel: 2.69 cm2
AV Area VTI: 2.58 cm2
AV Area mean vel: 2.66 cm2
AV Mean grad: 5 mmHg
AV Peak grad: 10 mmHg
Ao pk vel: 1.59 m/s
Area-P 1/2: 3.62 cm2
MV M vel: 4.34 m/s
MV Peak grad: 75.3 mmHg
Radius: 0.4 cm
S' Lateral: 3.1 cm

## 2023-11-11 DIAGNOSIS — J00 Acute nasopharyngitis [common cold]: Secondary | ICD-10-CM | POA: Diagnosis not present

## 2023-11-11 DIAGNOSIS — J3489 Other specified disorders of nose and nasal sinuses: Secondary | ICD-10-CM | POA: Diagnosis not present

## 2023-11-11 DIAGNOSIS — H6993 Unspecified Eustachian tube disorder, bilateral: Secondary | ICD-10-CM | POA: Diagnosis not present

## 2023-11-11 DIAGNOSIS — R070 Pain in throat: Secondary | ICD-10-CM | POA: Diagnosis not present

## 2023-11-14 ENCOUNTER — Ambulatory Visit: Attending: Cardiology | Admitting: Cardiology

## 2023-11-14 ENCOUNTER — Encounter: Payer: Self-pay | Admitting: Cardiology

## 2023-11-14 VITALS — BP 110/70 | HR 49 | Ht 66.0 in | Wt 170.0 lb

## 2023-11-14 DIAGNOSIS — F17210 Nicotine dependence, cigarettes, uncomplicated: Secondary | ICD-10-CM

## 2023-11-14 DIAGNOSIS — I10 Essential (primary) hypertension: Secondary | ICD-10-CM

## 2023-11-14 DIAGNOSIS — E782 Mixed hyperlipidemia: Secondary | ICD-10-CM | POA: Diagnosis not present

## 2023-11-14 DIAGNOSIS — J431 Panlobular emphysema: Secondary | ICD-10-CM | POA: Diagnosis not present

## 2023-11-14 DIAGNOSIS — I2510 Atherosclerotic heart disease of native coronary artery without angina pectoris: Secondary | ICD-10-CM | POA: Diagnosis not present

## 2023-11-14 NOTE — Patient Instructions (Addendum)
 Medication Instructions:  Your physician recommends that you continue on your current medications as directed. Please refer to the Current Medication list given to you today.  *If you need a refill on your cardiac medications before your next appointment, please call your pharmacy*  Lab Work: None If you have labs (blood work) drawn today and your tests are completely normal, you will receive your results only by: MyChart Message (if you have MyChart) OR A paper copy in the mail If you have any lab test that is abnormal or we need to change your treatment, we will call you to review the results.  Testing/Procedures: None  Follow-Up: At [facility], you and your health needs are our priority.  As part of our continuing mission to provide you with exceptional heart care, our providers are all part of one team.  This team includes your primary Cardiologist (physician) and Advanced Practice Providers or APPs (Physician Assistants and Nurse Practitioners) who all work together to provide you with the care you need, when you need it.  Your next appointment:   9 month(s)  Provider:   Belva Crome, MD    We recommend signing up for the patient portal called "MyChart".  Sign up information is provided on this After Visit Summary.  MyChart is used to connect with patients for Virtual Visits (Telemedicine).  Patients are able to view lab/test results, encounter notes, upcoming appointments, etc.  Non-urgent messages can be sent to your provider as well.   To learn more about what you can do with MyChart, go to [email or web address].   Other Instructions None

## 2023-11-14 NOTE — Progress Notes (Signed)
 Cardiology Office Note:    Date:  11/14/2023   ID:  Laurie Barr, DOB 1950/11/01, MRN [id number]  PCP:  Laurie Males, MD  Cardiologist:  Laurie JONELLE Crape, MD   Referring MD: Laurie Males, MD    ASSESSMENT:    1. Coronary artery disease involving native coronary artery of native heart without angina pectoris   2. Essential hypertension   3. Panlobular emphysema (HCC)   4. Mixed dyslipidemia   5. Cigarette smoker    PLAN:    In order of problems listed above:  Coronary artery disease: Secondary prevention stressed with the patient.  Importance of compliance with diet medication stressed and patient verbalized standing.  She was advised to walk half another day on a daily basis and she promises to do so. Essential hypertension: Blood pressure is stable and diet was emphasized Mixed dyslipidemia: On lipid-lowering medications and lipids are at goal. Cigarette smoker: I spent 5 minutes with the patient discussing solely about smoking. Smoking cessation was counseled. I suggested to the patient also different medications and pharmacological interventions. Patient is keen to try stopping on its own at this time. He will get back to me if he needs any further assistance in this matter. Patient will be seen in follow-up appointment in 6 months or earlier if the patient has any concerns.    Medication Adjustments/Labs and Tests Ordered: Current medicines are reviewed at length with the patient today.  Concerns regarding medicines are outlined above.  No orders of the defined types were placed in this encounter.  No orders of the defined types were placed in this encounter.    Chief Complaint  Patient presents with   Follow-up     History of Present Illness:    Laurie Barr is a 73 y.o. female.  Patient has a past medical history of coronary artery disease post stenting, essential hypertension, mixed dyslipidemia, unfortunately continues to smoke.  She denies any  problems at this time.  No chest pain orthopnea or PND.  She leads a sedentary lifestyle.  At the time of my evaluation, the patient is alert awake oriented and in no distress.  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   Angina pectoris (HCC) 06/28/2023   Anxiety    Asthma    Bronchiectasis (HCC) 10/01/2015   CAD (coronary artery disease) 12/19/2019   Chest tightness 08/12/2019   Cigarette smoker 10/20/2020   COPD (chronic obstructive pulmonary disease) (HCC) 10/01/2015   Dyspnea on exertion 08/12/2019   Endometriosis    Essential hypertension 06/28/2023   Ex-smoker 08/12/2019   GERD (gastroesophageal reflux disease)    History of colonic polyps    Hypercholesterolemia    Hypertrophy of bone, left ankle and foot 05/05/2016   IBS (irritable bowel syndrome)    Mixed dyslipidemia 08/12/2019   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 Mild sigmoid diverticulosis   CORONARY STENT INTERVENTION N/A 10/03/2023   Procedure: CORONARY STENT INTERVENTION;  Surgeon: Laurie Heinz, MD;  Location: [facility];  Service: Cardiovascular;  Laterality: N/A;   CYSTOSCOPY  11/17/2016   ELBOW SURGERY Left    removed bone due to fall/ Dr. Barbarann   ESOPHAGOGASTRODUODENOSCOPY  11/19/2015   Mild gastritis. Transient hiatal hernia. Otherwise, normal EGD   HAND SURGERY Right    removed cyst   LAPAROTOMY     multiple due to endometriosis fibrous tumors and adhesions   LEFT HEART CATH AND CORONARY  ANGIOGRAPHY N/A 10/03/2023   Procedure: LEFT HEART CATH AND CORONARY ANGIOGRAPHY;  Surgeon: Laurie Heinz, MD;  Location: [facility];  Service: Cardiovascular;  Laterality: N/A;   STOMACH SURGERY     Tumor removed size of grapefruit    Current Medications: Current Meds  Medication Sig   albuterol  (PROVENTIL  HFA;VENTOLIN  HFA) 108 (90 Base) MCG/ACT inhaler Inhale 2 puffs into the lungs every 6 (six) hours as needed for wheezing or shortness of breath.   aspirin  81 MG tablet Take 81 mg by mouth daily.   baclofen (LIORESAL) 10 MG tablet Take 10 mg by mouth 2 (two) times daily as needed for muscle spasms.   citalopram  (CELEXA ) 20 MG tablet Take 10 mg by mouth daily.   clopidogrel  (PLAVIX ) 75 MG tablet Take 1 tablet (75 mg total) by mouth daily with breakfast.   cyanocobalamin (VITAMIN B12) 1000 MCG tablet Take 1,000 mcg by mouth daily.   dicyclomine  (BENTYL ) 10 MG capsule TAKE 1 CAPSULE(10 MG) BY MOUTH TWICE DAILY   fexofenadine (ALLEGRA) 180 MG tablet Take 180 mg by mouth daily as needed for allergies or rhinitis.   fluticasone (FLONASE) 50 MCG/ACT nasal spray Place 2 sprays into both nostrils 2 (two) times daily.   Fluticasone-Umeclidin-Vilant (TRELEGY ELLIPTA) 100-62.5-25 MCG/ACT AEPB Inhale 1 puff into the lungs daily.   furosemide  (LASIX ) 20 MG tablet Take 1 tablet (20 mg total) by mouth daily.   guaifenesin (ROBITUSSIN) 100 MG/5ML syrup Take 200 mg by mouth 3 (three) times daily as needed for cough.   losartan  (COZAAR ) 50 MG tablet Take 50 mg by mouth daily.   meloxicam (MOBIC) 15 MG tablet Take 15 mg by mouth daily as needed for pain.   montelukast (SINGULAIR) 10 MG tablet Take 10 mg by mouth daily.   Multiple Vitamins-Minerals (IMMUNE SUPPORT PO) Take 1 tablet by mouth daily.   nitroGLYCERIN  (NITROSTAT ) 0.4 MG SL tablet Place 1 tablet (0.4 mg total) under the tongue every 5 (five) minutes as needed for chest pain.   pantoprazole  (PROTONIX ) 40 MG tablet TAKE 1 TABLET(40 MG) BY MOUTH  DAILY   predniSONE (DELTASONE) 20 MG tablet Take 20 mg by mouth daily with breakfast.   Probiotic Product (PROBIOTIC PO) Take 1 tablet by mouth daily.   rosuvastatin  (CRESTOR ) 40 MG tablet Take 40 mg by mouth daily.     Allergies:   Latex   Social History   Socioeconomic History   Marital status: Unknown    Spouse name: Not on file   Number of children: Not on file   Years of education: Not on file   Highest education level: Not on file  Occupational History   Not on file  Tobacco Use   Smoking status: Every Day    Current packs/day: 0.00    Types: Cigarettes    Last attempt to quit: 08/12/2015    Years since quitting: 8.2   Smokeless tobacco: Never  Vaping Use   Vaping status: Never Used  Substance and Sexual Activity   Alcohol  use: Not Currently  Drug use: Never   Sexual activity: Not Currently  Other Topics Concern   Not on file  Social History Narrative   Not on file   Social Drivers of Health   Financial Resource Strain: Not on file  Food Insecurity: No Food Insecurity (10/03/2023)   Hunger Vital Sign    Worried About Running Out of Food in the Last Year: Never true    Ran Out of Food in the Last Year: Never true  Transportation Needs: No Transportation Needs (10/03/2023)   PRAPARE - Administrator, Civil Service (Medical): No    Lack of Transportation (Non-Medical): No  Physical Activity: Not on file  Stress: Not on file  Social Connections: Unknown (10/04/2023)   Social Connection and Isolation Panel    Frequency of Communication with Friends and Family: More than three times a week    Frequency of Social Gatherings with Friends and Family: Twice a week    Attends Religious Services: 1 to 4 times per year    Active Member of [employer] or Organizations: No    Attends Banker Meetings: Never    Marital Status: Patient declined     Family History: The patient's family history includes Cancer in her father; Diabetes in her brother and  sister; Hypertension in her brother, father, mother, and sister. There is no history of Colon cancer, Esophageal cancer, or Heart disease.  ROS:   Please see the history of present illness.    All other systems reviewed and are negative.  EKGs/Labs/Other Studies Reviewed:    The following studies were reviewed today: I discussed my findings with the patient   Recent Labs: 09/20/2023: Hemoglobin 13.3; Platelets 192 10/11/2023: BUN 9; Creatinine, Ser 0.73; Potassium 4.2; Sodium 136  Recent Lipid Panel    Component Value Date/Time   CHOL 104 10/04/2023 0845   CHOL 160 06/03/2021 1108   TRIG 109 10/04/2023 0845   HDL 46 10/04/2023 0845   HDL 68 06/03/2021 1108   CHOLHDL 2.3 10/04/2023 0845   VLDL 22 10/04/2023 0845   LDLCALC 36 10/04/2023 0845   LDLCALC 62 06/03/2021 1108    Physical Exam:    VS:  BP 110/70   Pulse (!) 49   Ht 5' 6 (1.676 m)   SpO2 97%   BMI 27.12 kg/m     Wt Readings from Last 3 Encounters:  10/11/23 168 lb (76.2 kg)  10/03/23 171 lb 6.4 oz (77.7 kg)  09/20/23 169 lb 9.6 oz (76.9 kg)     GEN: Patient is in no acute distress HEENT: Normal NECK: No JVD; No carotid bruits LYMPHATICS: No lymphadenopathy CARDIAC: Hear sounds regular, 2/6 systolic murmur at the apex. RESPIRATORY:  Clear to auscultation without rales, wheezing or rhonchi  ABDOMEN: Soft, non-tender, non-distended MUSCULOSKELETAL:  No edema; No deformity  SKIN: Warm and dry NEUROLOGIC:  Alert and oriented x 3 PSYCHIATRIC:  Normal affect   Signed, Laurie JONELLE Crape, MD  11/14/2023 11:33 AM    [facility]

## 2023-11-20 DIAGNOSIS — J479 Bronchiectasis, uncomplicated: Secondary | ICD-10-CM | POA: Diagnosis not present

## 2023-11-20 DIAGNOSIS — I10 Essential (primary) hypertension: Secondary | ICD-10-CM | POA: Diagnosis not present

## 2023-11-20 DIAGNOSIS — E663 Overweight: Secondary | ICD-10-CM | POA: Diagnosis not present

## 2023-11-20 DIAGNOSIS — B370 Candidal stomatitis: Secondary | ICD-10-CM | POA: Diagnosis not present

## 2023-11-20 DIAGNOSIS — I2510 Atherosclerotic heart disease of native coronary artery without angina pectoris: Secondary | ICD-10-CM | POA: Diagnosis not present

## 2023-11-20 DIAGNOSIS — Z1159 Encounter for screening for other viral diseases: Secondary | ICD-10-CM | POA: Diagnosis not present

## 2023-11-20 DIAGNOSIS — R7301 Impaired fasting glucose: Secondary | ICD-10-CM | POA: Diagnosis not present

## 2023-11-20 DIAGNOSIS — Z6828 Body mass index (BMI) 28.0-28.9, adult: Secondary | ICD-10-CM | POA: Diagnosis not present

## 2023-11-20 DIAGNOSIS — E559 Vitamin D deficiency, unspecified: Secondary | ICD-10-CM | POA: Diagnosis not present

## 2023-11-20 DIAGNOSIS — K219 Gastro-esophageal reflux disease without esophagitis: Secondary | ICD-10-CM | POA: Diagnosis not present

## 2023-11-20 DIAGNOSIS — M810 Age-related osteoporosis without current pathological fracture: Secondary | ICD-10-CM | POA: Diagnosis not present

## 2023-11-20 DIAGNOSIS — R7303 Prediabetes: Secondary | ICD-10-CM | POA: Diagnosis not present

## 2023-11-20 DIAGNOSIS — E7800 Pure hypercholesterolemia, unspecified: Secondary | ICD-10-CM | POA: Diagnosis not present

## 2023-11-20 DIAGNOSIS — I700 Atherosclerosis of aorta: Secondary | ICD-10-CM | POA: Diagnosis not present

## 2023-11-20 DIAGNOSIS — D692 Other nonthrombocytopenic purpura: Secondary | ICD-10-CM | POA: Diagnosis not present

## 2023-11-21 LAB — LAB REPORT - SCANNED
A1c: 6.4
EGFR: 81
HM Hepatitis Screen: NEGATIVE

## 2023-11-26 DIAGNOSIS — Z79899 Other long term (current) drug therapy: Secondary | ICD-10-CM | POA: Diagnosis not present

## 2023-11-26 DIAGNOSIS — K219 Gastro-esophageal reflux disease without esophagitis: Secondary | ICD-10-CM | POA: Diagnosis not present

## 2023-11-26 DIAGNOSIS — I10 Essential (primary) hypertension: Secondary | ICD-10-CM | POA: Diagnosis not present

## 2023-11-26 DIAGNOSIS — E7800 Pure hypercholesterolemia, unspecified: Secondary | ICD-10-CM | POA: Diagnosis not present

## 2023-11-26 DIAGNOSIS — I2510 Atherosclerotic heart disease of native coronary artery without angina pectoris: Secondary | ICD-10-CM | POA: Diagnosis not present

## 2023-12-19 DIAGNOSIS — N3000 Acute cystitis without hematuria: Secondary | ICD-10-CM | POA: Diagnosis not present

## 2023-12-19 DIAGNOSIS — B3731 Acute candidiasis of vulva and vagina: Secondary | ICD-10-CM | POA: Diagnosis not present

## 2023-12-20 ENCOUNTER — Other Ambulatory Visit: Payer: Self-pay | Admitting: Gastroenterology

## 2023-12-25 ENCOUNTER — Telehealth: Payer: Self-pay | Admitting: Cardiology

## 2023-12-25 DIAGNOSIS — J4540 Moderate persistent asthma, uncomplicated: Secondary | ICD-10-CM | POA: Diagnosis not present

## 2023-12-25 DIAGNOSIS — R918 Other nonspecific abnormal finding of lung field: Secondary | ICD-10-CM | POA: Diagnosis not present

## 2023-12-25 DIAGNOSIS — F17210 Nicotine dependence, cigarettes, uncomplicated: Secondary | ICD-10-CM | POA: Diagnosis not present

## 2023-12-25 DIAGNOSIS — J479 Bronchiectasis, uncomplicated: Secondary | ICD-10-CM | POA: Diagnosis not present

## 2023-12-25 MED ORDER — CLOPIDOGREL BISULFATE 75 MG PO TABS: 75.0000 mg | ORAL_TABLET | Freq: Every day | ORAL | 3 refills | Status: AC

## 2023-12-25 NOTE — Telephone Encounter (Signed)
 RX sent in

## 2023-12-25 NOTE — Telephone Encounter (Signed)
 Patient needs refill on Plavix  75mg  90 day supply sent to [facility] on [facility].

## 2023-12-26 DIAGNOSIS — Z79899 Other long term (current) drug therapy: Secondary | ICD-10-CM | POA: Diagnosis not present

## 2023-12-26 DIAGNOSIS — K219 Gastro-esophageal reflux disease without esophagitis: Secondary | ICD-10-CM | POA: Diagnosis not present

## 2023-12-26 DIAGNOSIS — I10 Essential (primary) hypertension: Secondary | ICD-10-CM | POA: Diagnosis not present

## 2023-12-26 DIAGNOSIS — M810 Age-related osteoporosis without current pathological fracture: Secondary | ICD-10-CM | POA: Diagnosis not present

## 2023-12-26 DIAGNOSIS — E7800 Pure hypercholesterolemia, unspecified: Secondary | ICD-10-CM | POA: Diagnosis not present

## 2024-01-02 DIAGNOSIS — M79662 Pain in left lower leg: Secondary | ICD-10-CM | POA: Diagnosis not present

## 2024-01-15 ENCOUNTER — Ambulatory Visit: Payer: Self-pay | Admitting: Cardiology

## 2024-01-26 DIAGNOSIS — I10 Essential (primary) hypertension: Secondary | ICD-10-CM | POA: Diagnosis not present

## 2024-01-26 DIAGNOSIS — K219 Gastro-esophageal reflux disease without esophagitis: Secondary | ICD-10-CM | POA: Diagnosis not present

## 2024-01-26 DIAGNOSIS — E7800 Pure hypercholesterolemia, unspecified: Secondary | ICD-10-CM | POA: Diagnosis not present

## 2024-01-26 DIAGNOSIS — Z79899 Other long term (current) drug therapy: Secondary | ICD-10-CM | POA: Diagnosis not present

## 2024-02-18 DIAGNOSIS — H9203 Otalgia, bilateral: Secondary | ICD-10-CM | POA: Diagnosis not present

## 2024-02-18 DIAGNOSIS — R6883 Chills (without fever): Secondary | ICD-10-CM | POA: Diagnosis not present

## 2024-02-18 DIAGNOSIS — M549 Dorsalgia, unspecified: Secondary | ICD-10-CM | POA: Diagnosis not present

## 2024-02-19 DIAGNOSIS — E7800 Pure hypercholesterolemia, unspecified: Secondary | ICD-10-CM | POA: Diagnosis not present

## 2024-02-19 DIAGNOSIS — Z13820 Encounter for screening for osteoporosis: Secondary | ICD-10-CM | POA: Diagnosis not present

## 2024-02-19 DIAGNOSIS — K219 Gastro-esophageal reflux disease without esophagitis: Secondary | ICD-10-CM | POA: Diagnosis not present

## 2024-02-19 DIAGNOSIS — Z0000 Encounter for general adult medical examination without abnormal findings: Secondary | ICD-10-CM | POA: Diagnosis not present

## 2024-02-19 DIAGNOSIS — I10 Essential (primary) hypertension: Secondary | ICD-10-CM | POA: Diagnosis not present

## 2024-02-19 DIAGNOSIS — Z1389 Encounter for screening for other disorder: Secondary | ICD-10-CM | POA: Diagnosis not present

## 2024-02-19 DIAGNOSIS — Z2821 Immunization not carried out because of patient refusal: Secondary | ICD-10-CM | POA: Diagnosis not present

## 2024-02-19 DIAGNOSIS — R7303 Prediabetes: Secondary | ICD-10-CM | POA: Diagnosis not present

## 2024-02-21 DIAGNOSIS — R300 Dysuria: Secondary | ICD-10-CM | POA: Diagnosis not present

## 2024-02-21 DIAGNOSIS — N952 Postmenopausal atrophic vaginitis: Secondary | ICD-10-CM | POA: Diagnosis not present

## 2024-02-21 DIAGNOSIS — R339 Retention of urine, unspecified: Secondary | ICD-10-CM | POA: Diagnosis not present

## 2024-02-21 DIAGNOSIS — B3731 Acute candidiasis of vulva and vagina: Secondary | ICD-10-CM | POA: Diagnosis not present

## 2024-02-21 DIAGNOSIS — N390 Urinary tract infection, site not specified: Secondary | ICD-10-CM | POA: Diagnosis not present

## 2024-02-25 DIAGNOSIS — F411 Generalized anxiety disorder: Secondary | ICD-10-CM | POA: Diagnosis not present

## 2024-02-25 DIAGNOSIS — Z79899 Other long term (current) drug therapy: Secondary | ICD-10-CM | POA: Diagnosis not present

## 2024-02-25 DIAGNOSIS — K219 Gastro-esophageal reflux disease without esophagitis: Secondary | ICD-10-CM | POA: Diagnosis not present

## 2024-02-25 DIAGNOSIS — I10 Essential (primary) hypertension: Secondary | ICD-10-CM | POA: Diagnosis not present

## 2024-02-25 DIAGNOSIS — E7800 Pure hypercholesterolemia, unspecified: Secondary | ICD-10-CM | POA: Diagnosis not present

## 2024-03-19 ENCOUNTER — Other Ambulatory Visit: Payer: Self-pay | Admitting: Gastroenterology
